# Patient Record
Sex: Female | Born: 1995 | Hispanic: Yes | Marital: Single | Smoking: Former smoker
Health system: Southern US, Community
[De-identification: ages and names within clinical notes are randomized; demographics above are authoritative.]

## PROBLEM LIST (undated history)

## (undated) ENCOUNTER — Inpatient Hospital Stay: Payer: Self-pay

## (undated) DIAGNOSIS — F4310 Post-traumatic stress disorder, unspecified: Secondary | ICD-10-CM

## (undated) DIAGNOSIS — F331 Major depressive disorder, recurrent, moderate: Secondary | ICD-10-CM

## (undated) HISTORY — DX: Post-traumatic stress disorder, unspecified: F43.10

## (undated) HISTORY — DX: Major depressive disorder, recurrent, moderate: F33.1

## (undated) HISTORY — PX: NO PAST SURGERIES: SHX2092

---
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
## 2017-03-11 ENCOUNTER — Telehealth: Payer: Self-pay | Admitting: *Deleted

## 2017-03-11 NOTE — Telephone Encounter (Signed)
I called pharmacy to verify rx still had refills.  It had one more refill. I requested the pharmacy fill the rx and pt would pick up later today.  I advised pt rx is being filled and she can pick up later. She voiced understanding and agreed with plan.

## 2017-03-11 NOTE — Telephone Encounter (Signed)
Patient has an appt on Wednesday for Depo but states pharmacy says she has no refills but chart shows it should be on refill left,please advise.Marland Kitchen..Marland Kitchen

## 2017-03-13 ENCOUNTER — Ambulatory Visit: Payer: Medicaid Other

## 2017-03-25 ENCOUNTER — Ambulatory Visit: Payer: Medicaid Other | Admitting: Pediatrics

## 2017-03-25 DIAGNOSIS — Z3040 Encounter for surveillance of contraceptives, unspecified: Secondary | ICD-10-CM

## 2017-03-25 MED ORDER — NORETHINDRONE-ETH ESTRADIOL 1-35 MG-MCG PO TABS: 1.0000 | ORAL_TABLET | Freq: Every day | ORAL | 11 refills | Status: DC

## 2017-03-25 NOTE — Progress Notes (Signed)
Per verbal order from Dr. Clearance CootsHarper:  Patient should discontinue Depo Provera and start OCP.  He recommends Ortho Novum 1/35.  I sent rx to pharmacy and advised patient to start first pill tomorrow.  I advised to take pill at the same time everyday and to use back up method if takes ATBs. She voiced understanding and agreed with plan.

## 2017-11-11 ENCOUNTER — Encounter: Payer: Self-pay | Admitting: Certified Nurse Midwife

## 2017-11-19 ENCOUNTER — Ambulatory Visit: Payer: Self-pay | Admitting: Obstetrics

## 2018-03-07 ENCOUNTER — Other Ambulatory Visit: Payer: Self-pay | Admitting: Obstetrics

## 2018-03-11 ENCOUNTER — Encounter: Payer: Self-pay | Admitting: Obstetrics

## 2018-03-11 ENCOUNTER — Ambulatory Visit (INDEPENDENT_AMBULATORY_CARE_PROVIDER_SITE_OTHER): Payer: Medicaid Other | Admitting: Obstetrics

## 2018-03-11 ENCOUNTER — Other Ambulatory Visit (HOSPITAL_COMMUNITY)
Admission: RE | Admit: 2018-03-11 | Discharge: 2018-03-11 | Disposition: A | Discharge status: Home / Self Care | Payer: Medicaid Other | Source: Ambulatory Visit | Attending: Obstetrics | Admitting: Obstetrics

## 2018-03-11 VITALS — BP 139/81 | HR 85 | Ht 64.0 in | Wt 176.0 lb

## 2018-03-11 DIAGNOSIS — B9689 Other specified bacterial agents as the cause of diseases classified elsewhere: Secondary | ICD-10-CM | POA: Diagnosis not present

## 2018-03-11 DIAGNOSIS — Z0000 Encounter for general adult medical examination without abnormal findings: Secondary | ICD-10-CM

## 2018-03-11 DIAGNOSIS — R8290 Unspecified abnormal findings in urine: Secondary | ICD-10-CM

## 2018-03-11 DIAGNOSIS — N898 Other specified noninflammatory disorders of vagina: Secondary | ICD-10-CM

## 2018-03-11 DIAGNOSIS — Z113 Encounter for screening for infections with a predominantly sexual mode of transmission: Secondary | ICD-10-CM

## 2018-03-11 DIAGNOSIS — N760 Acute vaginitis: Secondary | ICD-10-CM | POA: Insufficient documentation

## 2018-03-11 DIAGNOSIS — Z01419 Encounter for gynecological examination (general) (routine) without abnormal findings: Secondary | ICD-10-CM

## 2018-03-11 DIAGNOSIS — Z3041 Encounter for surveillance of contraceptive pills: Secondary | ICD-10-CM

## 2018-03-11 DIAGNOSIS — N946 Dysmenorrhea, unspecified: Secondary | ICD-10-CM

## 2018-03-11 LAB — POCT URINALYSIS DIPSTICK
Bilirubin, UA: NEGATIVE
Glucose, UA: NEGATIVE
Ketones, UA: NEGATIVE
LEUKOCYTES UA: NEGATIVE
Nitrite, UA: NEGATIVE
PH UA: 6 (ref 5.0–8.0)
Protein, UA: NEGATIVE
RBC UA: NEGATIVE
Spec Grav, UA: 1.025 (ref 1.010–1.025)
UROBILINOGEN UA: 0.2 U/dL

## 2018-03-11 MED ORDER — IBUPROFEN 800 MG PO TABS: 800.0000 mg | ORAL_TABLET | Freq: Three times a day (TID) | ORAL | 5 refills | Status: DC | PRN

## 2018-03-11 MED ORDER — METRONIDAZOLE 500 MG PO TABS: 500.0000 mg | ORAL_TABLET | Freq: Two times a day (BID) | ORAL | 2 refills | Status: DC

## 2018-03-11 NOTE — Addendum Note (Signed)
Addended by: Kennon Portela on: 03/11/2018 03:50 PM   Modules accepted: Orders

## 2018-03-11 NOTE — Progress Notes (Signed)
Subjective:        Kathy Walker is a 22 y.o. female here for a routine exam.  Current complaints: Odor to urine..    Personal health questionnaire:  Is patient Kathy Walker, have a family history of breast and/or ovarian cancer: no Is there a family history of uterine cancer diagnosed at age < 66, gastrointestinal cancer, urinary tract cancer, family member who is a Personnel officer syndrome-associated carrier: no Is the patient overweight and hypertensive, family history of diabetes, personal history of gestational diabetes, preeclampsia or PCOS: no Is patient over 43, have PCOS,  family history of premature CHD under age 89, diabetes, smoke, have hypertension or peripheral artery disease:  no At any time, has a partner hit, kicked or otherwise hurt or frightened you?: no Over the past 2 weeks, have you felt down, depressed or hopeless?: no Over the past 2 weeks, have you felt little interest or pleasure in doing things?:no   Gynecologic History Patient's last menstrual period was 03/04/2018 (exact date). Contraception: OCP (estrogen/progesterone) Last Pap: n/a. Results were: n/a Last mammogram: n/a. Results were: n/a  Obstetric History OB History  Gravida Para Term Preterm AB Living  2 2 2  0 0 2  SAB TAB Ectopic Multiple Live Births  0 0 0 0 2    # Outcome Date GA Lbr Len/2nd Weight Sex Delivery Anes PTL Lv  2 Term 05/23/16 [redacted]w[redacted]d 18:55 / 00:02 7 lb 12 oz (3.515 kg) F Vag-Spont None  LIV  1 Term 02/09/15 [redacted]w[redacted]d  8 lb 2 oz (3.685 kg) F Vag-Spont  N LIV    Past Medical History:  Diagnosis Date  . MDD (major depressive disorder), recurrent episode, moderate (HCC) 12/31/2012  . Post traumatic stress disorder (PTSD) 12/31/2012    Past Surgical History:  Procedure Laterality Date  . NO PAST SURGERIES       Current Outpatient Medications:  .  PIRMELLA 1/35 tablet, TAKE 1 TABLET BY MOUTH ONCE DAILY, Disp: 28 tablet, Rfl: 11 .  dicyclomine (BENTYL) 20 MG tablet, Take 1 tablet (20  mg total) by mouth 2 (two) times daily. (Patient not taking: Reported on 03/11/2018), Disp: 20 tablet, Rfl: 0 .  ibuprofen (ADVIL,MOTRIN) 600 MG tablet, Take 1 tablet (600 mg total) by mouth every 6 (six) hours. (Patient not taking: Reported on 03/11/2018), Disp: 120 tablet, Rfl: 2 .  medroxyPROGESTERone (DEPO-PROVERA) 150 MG/ML injection, Inject 1 mL (150 mg total) into the muscle every 3 (three) months. (Patient not taking: Reported on 03/11/2018), Disp: 1 mL, Rfl: 3 .  Multiple Vitamins-Minerals (MULTIVITAMIN WITH MINERALS) tablet, Take 1 tablet by mouth daily., Disp: , Rfl:  .  omeprazole (PRILOSEC) 40 MG capsule, Take 1 capsule (40 mg total) by mouth 2 (two) times daily., Disp: 28 capsule, Rfl: 0 .  ondansetron (ZOFRAN ODT) 4 MG disintegrating tablet, Take 1 tablet (4 mg total) by mouth every 8 (eight) hours as needed for nausea or vomiting. (Patient not taking: Reported on 03/11/2018), Disp: 20 tablet, Rfl: 0 .  oxyCODONE-acetaminophen (PERCOCET/ROXICET) 5-325 MG tablet, Take 1 tablet by mouth every 6 (six) hours as needed for severe pain. (Patient not taking: Reported on 07/02/2016), Disp: 12 tablet, Rfl: 0 .  Prenatal Multivit-Min-Fe-FA (PRENATAL VITAMINS) 0.8 MG tablet, Take 1 tablet by mouth daily. (Patient not taking: Reported on 09/27/2016), Disp: 30 tablet, Rfl: 12 Allergies  Allergen Reactions  . Kathy Walker Itching  . Kathy Walker Itching    Social History   Tobacco Use  . Smoking status: Former Games developer  .  Smokeless tobacco: Never Used  Substance Use Topics  . Alcohol use: No    History reviewed. No pertinent family history.    Review of Systems  Constitutional: negative for fatigue and weight loss Respiratory: negative for cough and wheezing Cardiovascular: negative for chest pain, fatigue and palpitations Gastrointestinal: negative for abdominal pain and change in bowel habits Musculoskeletal:negative for myalgias Neurological: negative for gait problems and  tremors Behavioral/Psych: negative for abusive relationship, depression Endocrine: negative for temperature intolerance    Genitourinary:negative for abnormal menstrual periods, genital lesions, hot flashes, sexual problems and vaginal discharge.  Positive for odor to urine Integument/breast: negative for breast lump, breast tenderness, nipple discharge and skin lesion(s)    Objective:       BP 139/81   Pulse 85   Ht 5\' 4"  (1.626 m)   Wt 176 lb (79.8 kg)   LMP 03/04/2018 (Exact Date)   Breastfeeding? No   BMI 30.21 kg/m  General:   alert  Skin:   no rash or abnormalities  Lungs:   clear to auscultation bilaterally  Heart:   regular rate and rhythm, S1, S2 normal, no murmur, click, rub or gallop  Breasts:   normal without suspicious masses, skin or nipple changes or axillary nodes  Abdomen:  normal findings: no organomegaly, soft, non-tender and no hernia  Pelvis:  External genitalia: normal general appearance Urinary system: urethral meatus normal and bladder without fullness, nontender Vaginal: normal without tenderness, induration or masses Cervix: normal appearance Adnexa: normal bimanual exam Uterus: anteverted and non-tender, normal size   Lab Review Urine pregnancy test Labs reviewed yes Radiologic studies reviewed no  50% of 20 min visit spent on counseling and coordination of care.   Assessment:      1. Encounter for gynecrease fluids cological examination  2. Dysmenorrhea Rx: - ibuprofen (ADVIL,MOTRIN) 800 MG tablet; Take 1 tablet (800 mg total) by mouth every 8 (eight) hours as needed.  Dispense: 40 tablet; Refill: 5  3. Abnormal urine odor Rx: - POCT Urinalysis Dipstick:  Negative, except concentrated and dark yellow indicating dehydration - increase fluids  4. Vaginal discharge - probable BV  5. BV (bacterial vaginosis) Rx: - metroNIDAZOLE (FLAGYL) 500 MG tablet; Take 1 tablet (500 mg total) by mouth 2 (two) times daily.  Dispense: 14 tablet;  Refill: 2  6. Screening for STD (sexually transmitted disease) Rx: - HIV Antibody (routine testing w rflx) - RPR  7. Encounter for surveillance of contraceptive pills - pleased with OCP's    Plan:    Education reviewed: calcium supplements, depression evaluation, low fat, low cholesterol diet, safe sex/STD prevention, self breast exams and weight bearing exercise. Contraception: OCP (estrogen/progesterone). Follow up in: 1 year.   No orders of the defined types were placed in this encounter.  Orders Placed This Encounter  Procedures  . POCT Urinalysis Dipstick    Brock Bad MD 03-11-2018

## 2018-03-11 NOTE — Progress Notes (Signed)
RGYN pt here for AEX and to discuss contraception currently on birth control pills pt has no complaints w/ pills.   Contraception: PIRMELLA  STD Testing: Declines   LMP: 03/04/18  CC: Odor in urine.advised to leave a sample.  Rash on right eye lid

## 2018-03-12 LAB — HIV ANTIBODY (ROUTINE TESTING W REFLEX): HIV SCREEN 4TH GENERATION: NONREACTIVE

## 2018-03-12 LAB — CERVICOVAGINAL ANCILLARY ONLY
BACTERIAL VAGINITIS: NEGATIVE
CANDIDA VAGINITIS: NEGATIVE
Chlamydia: NEGATIVE
Neisseria Gonorrhea: NEGATIVE
Trichomonas: NEGATIVE

## 2018-03-12 LAB — RPR: RPR Ser Ql: NONREACTIVE

## 2018-07-01 ENCOUNTER — Ambulatory Visit: Payer: Medicaid Other | Admitting: Obstetrics

## 2018-11-15 ENCOUNTER — Other Ambulatory Visit: Payer: Self-pay

## 2018-11-15 ENCOUNTER — Emergency Department (HOSPITAL_COMMUNITY)
Admission: EM | Admit: 2018-11-15 | Discharge: 2018-11-16 | Disposition: A | Discharge status: Home / Self Care | Payer: Medicaid Other | Attending: Emergency Medicine | Admitting: Emergency Medicine

## 2018-11-15 ENCOUNTER — Encounter (HOSPITAL_COMMUNITY): Payer: Self-pay | Admitting: *Deleted

## 2018-11-15 DIAGNOSIS — I6501 Occlusion and stenosis of right vertebral artery: Secondary | ICD-10-CM | POA: Insufficient documentation

## 2018-11-15 DIAGNOSIS — R42 Dizziness and giddiness: Secondary | ICD-10-CM | POA: Insufficient documentation

## 2018-11-15 DIAGNOSIS — Z87891 Personal history of nicotine dependence: Secondary | ICD-10-CM | POA: Insufficient documentation

## 2018-11-15 DIAGNOSIS — Z79899 Other long term (current) drug therapy: Secondary | ICD-10-CM | POA: Insufficient documentation

## 2018-11-15 MED ORDER — ONDANSETRON HCL 4 MG/2ML IJ SOLN
4.0000 mg | Freq: Once | INTRAMUSCULAR | Status: DC
  Filled 2018-11-15: qty 2

## 2018-11-15 MED ORDER — SODIUM CHLORIDE 0.9 % IV BOLUS
1000.0000 mL | Freq: Once | INTRAVENOUS | Status: AC
  Administered 2018-11-16: 02:00:00 1000 mL via INTRAVENOUS

## 2018-11-15 NOTE — ED Notes (Signed)
Pt denies numbness and tingling in extremities. Pt states she gets dizzy and nauseated w/movement and the light.

## 2018-11-15 NOTE — ED Provider Notes (Signed)
[facility] EMERGENCY [facility] Provider Note   CSN: [id number] Arrival date & time: 11/15/18  2023    History   Chief Complaint Chief Complaint  Patient presents with  . Neck Pain     HPI Kathy Walker is a 23 y.o. female.     The history is provided by the patient.  Neck Pain Pain location:  Occipital region Quality:  Stiffness and burning Stiffness is present:  All day Pain radiates to:  Head Pain severity:  Severe Pain is:  Same all the time Onset quality:  Sudden Duration:  3 days Timing:  Constant Progression:  Worsening Chronicity:  New Context comment:  "cracked/popped" her neck and felt instant sharp pain Relieved by:  Nothing Worsened by:  Position, stress and twisting Ineffective treatments:  Ice, heat, analgesics, NSAIDs and neck support Associated symptoms: headaches, visual change and weakness   Associated symptoms: no bladder incontinence, no bowel incontinence, no chest pain, no fever, no numbness, no syncope and no tingling     Past Medical History:  Diagnosis Date  . MDD (major depressive disorder), recurrent episode, moderate (HCC) 12/31/2012  . Post traumatic stress disorder (PTSD) 12/31/2012    Patient Active Problem List   Diagnosis Date Noted  . Indication for care in labor or delivery 05/23/2016  . Encounter for supervision of normal pregnancy in multigravida 12/19/2015  . Teen pregnancy 12/19/2015    Past Surgical History:  Procedure Laterality Date  . NO PAST SURGERIES       OB History    Gravida  2   Para  2   Term  2   Preterm  0   AB  0   Living  2     SAB  0   TAB  0   Ectopic  0   Multiple  0   Live Births  2            Home Medications    Prior to Admission medications   Medication Sig Start Date End Date Taking? Authorizing Provider  dicyclomine (BENTYL) 20 MG tablet Take 1 tablet (20 mg total) by mouth 2 (two) times daily. Patient not taking: Reported on 03/11/2018 10/09/16    Dorena BodoKennard, Lawrence, NP  ibuprofen (ADVIL,MOTRIN) 800 MG tablet Take 1 tablet (800 mg total) by mouth every 8 (eight) hours as needed. 03/11/18   Brock BadHarper, Charles A, MD  medroxyPROGESTERone (DEPO-PROVERA) 150 MG/ML injection Inject 1 mL (150 mg total) into the muscle every 3 (three) months. Patient not taking: Reported on 03/11/2018 06/29/16   Brock BadHarper, Charles A, MD  metroNIDAZOLE (FLAGYL) 500 MG tablet Take 1 tablet (500 mg total) by mouth 2 (two) times daily. 03/11/18   Brock BadHarper, Charles A, MD  Multiple Vitamins-Minerals (MULTIVITAMIN WITH MINERALS) tablet Take 1 tablet by mouth daily.    [provider]  omeprazole (PRILOSEC) 40 MG capsule Take 1 capsule (40 mg total) by mouth 2 (two) times daily. 10/09/16 10/23/16  Dorena BodoKennard, Lawrence, NP  ondansetron (ZOFRAN ODT) 4 MG disintegrating tablet Take 1 tablet (4 mg total) by mouth every 8 (eight) hours as needed for nausea or vomiting. Patient not taking: Reported on 03/11/2018 10/09/16   Dorena BodoKennard, Lawrence, NP  oxyCODONE-acetaminophen (PERCOCET/ROXICET) 5-325 MG tablet Take 1 tablet by mouth every 6 (six) hours as needed for severe pain. Patient not taking: Reported on 07/02/2016 05/30/16   Kathlene CoteWenzel, Julie N, PA-C  [facility] TAKE 1 TABLET BY MOUTH ONCE DAILY 03/08/18   Brock BadHarper, Charles A,  MD  Prenatal Multivit-Min-Fe-FA (PRENATAL VITAMINS) 0.8 MG tablet Take 1 tablet by mouth daily. Patient not taking: Reported on 09/27/2016 11/24/15   Osborne Oman, MD    Family History No family history on file.  Social History Social History   Tobacco Use  . Smoking status: Former Research scientist (life sciences)  . Smokeless tobacco: Never Used  Substance Use Topics  . Alcohol use: No  . Drug use: Yes    Types: Marijuana     Allergies   Cherry and Raspberry   Review of Systems Review of Systems  Constitutional: Negative for chills and fever.  HENT: Negative for ear pain and sore throat.   Eyes: Positive for pain (intermittent, worse on the left) and visual  disturbance (intermittent blurred vision).  Respiratory: Negative for cough and shortness of breath.   Cardiovascular: Negative for chest pain, palpitations and syncope.  Gastrointestinal: Positive for nausea. Negative for abdominal pain, bowel incontinence and vomiting.  Genitourinary: Negative for bladder incontinence, dysuria and hematuria.  Musculoskeletal: Positive for neck pain (bilateral posterior neck ) and neck stiffness. Negative for arthralgias and back pain.  Skin: Negative for color change and rash.  Neurological: Positive for dizziness, weakness, light-headedness and headaches. Negative for tingling, seizures, syncope and numbness.  All other systems reviewed and are negative.    Physical Exam Updated Vital Signs BP 126/90   Pulse 86   Temp 98.4 F (36.9 C) (Oral)   Resp 16   LMP 11/11/2018   SpO2 100%   Physical Exam Vitals signs and nursing note reviewed.  Constitutional:      General: She is awake. She is not in acute distress (appears uncomfortable).    Appearance: Normal appearance. She is well-developed.  HENT:     Head: Normocephalic and atraumatic.     Right Ear: External ear normal.     Left Ear: External ear normal.     Nose: Nose normal.     Mouth/Throat:     Lips: Pink.     Mouth: Mucous membranes are moist.  Eyes:     General: Lids are normal. Gaze aligned appropriately.     Extraocular Movements:     Right eye: Nystagmus (gaze-evoked nystagmus in upper outer quadrant) present.     Left eye: Nystagmus (gaze-evoked nystagmus in upper outer quadrant) present.     Conjunctiva/sclera: Conjunctivae normal.  Neck:     Musculoskeletal: Pain with movement and spinous process tenderness present.     Trachea: Trachea and phonation normal.  Cardiovascular:     Rate and Rhythm: Normal rate and regular rhythm.     Pulses: Normal pulses.     Heart sounds: No murmur.  Pulmonary:     Effort: Pulmonary effort is normal. No respiratory distress.     Breath  sounds: Normal breath sounds.  Abdominal:     General: Abdomen is flat.     Palpations: Abdomen is soft.     Tenderness: There is no abdominal tenderness.  Musculoskeletal: Normal range of motion.  Lymphadenopathy:     Cervical: No cervical adenopathy.  Skin:    General: Skin is warm and dry.  Neurological:     General: No focal deficit present.     Mental Status: She is alert and oriented to person, place, and time.     GCS: GCS eye subscore is 4. GCS verbal subscore is 5. GCS motor subscore is 6.     Comments: Dysmetria noted with finger-nose testing.  No overt overshoot lower undershoot, palpation significantly  sluggish with movement and became dizzy with testing.  Psychiatric:        Behavior: Behavior is cooperative.      ED Treatments / Results  Labs (all labs ordered are listed, but only abnormal results are displayed) Labs Reviewed  BASIC METABOLIC PANEL - Abnormal; Notable for the following components:      Result Value   CO2 21 (*)    All other components within normal limits  CBC WITH DIFFERENTIAL/PLATELET    EKG None  Radiology No results found.  CTA Head and Neck pending  Procedures Procedures (including critical care time)  Medications Ordered in ED Medications  sodium chloride 0.9 % bolus 1,000 mL (has no administration in time range)  ondansetron (ZOFRAN) injection 4 mg (has no administration in time range)     Initial Impression / Assessment and Plan / ED Course  I have reviewed the triage vital signs and the nursing notes.  Pertinent labs & imaging results that were available during my care of the patient were reviewed by me and considered in my medical decision making (see chart for details).  Differentials considered: Cervical muscle strain, carotid dissection, vertebral artery dissection, cervical disc herniation, occipital headache, tension headache   EM Physician interpretation of Labs:  . Labs pending at time of handoff   Medical  Decision Making:  Kathy Walker is a 23 y.o. female with past medical history of MDD and PTSD who presented to the emergency department today for evaluation of 3-day history of sudden onset throbbing and sharp bilateral posterior neck pain following cracking/popping her neck.  She reported that she frequently cracks her neck when she is stressed and believes that when she did so 3 days ago she believes that she did it too rapidly and too hard.  Since that time she has had constant and progressive posterior neck pain that radiates up the occipital portion of her head across the top into her frontal head.  She does not had any relief with OTC Tylenol, NSAIDs, heating pads, warm showers, cold pack.  She has had intermittent dizziness and lightheadedness as well as occasional blurred vision since this incident.  She is also had nausea with position changes.  She arrived afebrile and hemodynamically stable.  She appeared uncomfortable.  Her physical examination as described above but is concerning for several findings.  Of note, patient was seen to become dizzy upon sitting up from a lying position.  She also had what appeared to be gaze evoked nystagmus bilaterally when looking upward and outward while testing extraocular movements, she also was noted to have bilateral horizontal beating nystagmus.  During cerebellar coordination testing with finger-to nose assessment, she was quite sluggish with her movements and again appeared to become dizzy when her arm was outstretched all the way.   At the time of transfer of care, patient's lab work and imaging is pending.  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   This patient was staffed with and care transferred to my attending physician, Dr. Geoffery Lyonsouglas Delo.    Final Clinical Impressions(s) / ED Diagnoses   Final diagnoses:  None      Saverio DankerWilliams, Nikoleta Dady A, MD 11/16/18 [id number]    Geoffery Lyonselo, Douglas, MD 11/16/18 [phone]

## 2018-11-15 NOTE — ED Triage Notes (Signed)
Pt "cracked her neck" 3 days ago; reports increased pain on both side of her neck with severe headache. Also reports dizziness when standing too long. Has tried numerous different OTC meds without any relief

## 2018-11-15 NOTE — ED Notes (Signed)
Pt ambulatory to bathroom

## 2018-11-16 ENCOUNTER — Emergency Department (HOSPITAL_COMMUNITY): Payer: Medicaid Other

## 2018-11-16 DIAGNOSIS — S15101A Unspecified injury of right vertebral artery, initial encounter: Secondary | ICD-10-CM

## 2018-11-16 LAB — CBC WITH DIFFERENTIAL/PLATELET
Abs Immature Granulocytes: 0.01 10*3/uL (ref 0.00–0.07)
Basophils Absolute: 0 10*3/uL (ref 0.0–0.1)
Basophils Relative: 0 %
Eosinophils Absolute: 0.1 10*3/uL (ref 0.0–0.5)
Eosinophils Relative: 1 %
HCT: 44.6 % (ref 36.0–46.0)
Hemoglobin: 14.5 g/dL (ref 12.0–15.0)
Immature Granulocytes: 0 %
Lymphocytes Relative: 57 %
Lymphs Abs: 2.8 10*3/uL (ref 0.7–4.0)
MCH: 28.6 pg (ref 26.0–34.0)
MCHC: 32.5 g/dL (ref 30.0–36.0)
MCV: 88 fL (ref 80.0–100.0)
Monocytes Absolute: 0.4 10*3/uL (ref 0.1–1.0)
Monocytes Relative: 7 %
Neutro Abs: 1.7 10*3/uL (ref 1.7–7.7)
Neutrophils Relative %: 35 %
Platelets: 267 10*3/uL (ref 150–400)
RBC: 5.07 MIL/uL (ref 3.87–5.11)
RDW: 12.5 % (ref 11.5–15.5)
WBC: 5 10*3/uL (ref 4.0–10.5)
nRBC: 0 % (ref 0.0–0.2)

## 2018-11-16 LAB — BASIC METABOLIC PANEL
Anion gap: 10 (ref 5–15)
BUN: 8 mg/dL (ref 6–20)
CO2: 21 mmol/L — ABNORMAL LOW (ref 22–32)
Calcium: 8.9 mg/dL (ref 8.9–10.3)
Chloride: 110 mmol/L (ref 98–111)
Creatinine, Ser: 0.71 mg/dL (ref 0.44–1.00)
GFR calc Af Amer: >60 mL/min (ref >60)
GFR calc non Af Amer: >60 mL/min (ref >60)
Glucose, Bld: 89 mg/dL (ref 70–99)
Potassium: 3.5 mmol/L (ref 3.5–5.1)
Sodium: 141 mmol/L (ref 135–145)

## 2018-11-16 MED ORDER — IOHEXOL 350 MG/ML SOLN
75.0000 mL | Freq: Once | INTRAVENOUS | Status: AC | PRN
  Administered 2018-11-16: 01:00:00 75 mL via INTRAVENOUS

## 2018-11-16 NOTE — Discharge Instructions (Addendum)
Begin taking a 325 mg aspirin once daily.  You are to follow-up in 3 months with [facility] neurology for a repeat CT Angiogram of your neck.  Return to the emergency department in the meantime if you develop worsening headache, severe dizziness, or other new and concerning symptoms.

## 2018-11-16 NOTE — Consult Note (Addendum)
Neurology Consultation  Reason for Consult: Headache, neck pain, abnormal CT angiogram head and neck Referring Physician: Dr. Judd LienELO  CC: Headache, neck pain  History is obtained from: Patient  HPI: Kathy Walker is a 23 y.o. female who has a past medical history of PTSD, major depressive disorder, presented to the emergency room for evaluation of headache that has now been going on for 3 days and has gradually worsened to the point where she deemed appropriate to seek medical care. She reports that she is in the habit of cracking her neck side to side occasionally and she did that 3 days ago and since then has been having pain in the back of her neck and radiating to her head that has worsened and not responded to over-the-counter treatment such as Tylenol. She also reported initially feeling a little dizzy which she described as lightheadedness if she stands too long and at this time denied any visual symptoms, but the ED provider says at one point she said yes to having had some visual changes with the headache. Does not have a strong history of migraines.  This pain in the neck and head is worsened by movement and certain positions.  No relieving factors including over-the-counter medications. Initial examination by the ED provider was concerning for possible dysmetria and some nystagmus for which a head CT and a CT angios head and neck was obtained-CT head was normal but the CT angios head and neck showed a small segment of irregularity in the right vertebral artery V2 segment concerning for a grade 1 blunt cerebrovascular injury.  Neurological consultation was obtained for further recommendations.  She denies any chest pain.  Reports shortness of breath due to her increased weight which is normal for her.  Denies any tingling numbness weakness.  Denies any frank double vision diplopia.  Reports some photophobia but no other visual symptoms to me.  No family history of strokes.  No family  history of headache.  Describes herself as a recent tobacco quitter-quit smoking a week ago.  Also has a history of marijuana abuse that she quit a week ago.  LKW: 3 days ago tpa given?: no, outside the window Premorbid modified Rankin scale (mRS):0  ROS: ROS was performed and is negative except as noted in the HPI.   Past Medical History:  Diagnosis Date  . MDD (major depressive disorder), recurrent episode, moderate (HCC) 12/31/2012  . Post traumatic stress disorder (PTSD) 12/31/2012   No family history on file. No family history of strokes  Social History:   reports that she has quit smoking. She has never used smokeless tobacco. She reports current drug use. Drug: Marijuana. She reports that she does not drink alcohol. She reports she has quit smoking about a week ago.  Also uses marijuana, which she reports having quit a week ago.  Does not drink alcohol.  Medications  Current Facility-Administered Medications:  .  ondansetron (ZOFRAN) injection 4 mg, 4 mg, Intravenous, Once, Saverio DankerWilliams, Drew A, MD  Current Outpatient Medications:  .  acetaminophen (TYLENOL) 325 MG tablet, Take 325 mg by mouth every 6 (six) hours as needed for mild pain., Disp: , Rfl:  .  ibuprofen (ADVIL) 200 MG tablet, Take 200 mg by mouth every 6 (six) hours as needed for moderate pain., Disp: , Rfl:  .  PIRMELLA 1/35 tablet, TAKE 1 TABLET BY MOUTH ONCE DAILY (Patient taking differently: Take 1 tablet by mouth daily. ), Disp: 28 tablet, Rfl: 11 .  dicyclomine (BENTYL)  20 MG tablet, Take 1 tablet (20 mg total) by mouth 2 (two) times daily. (Patient not taking: Reported on 03/11/2018), Disp: 20 tablet, Rfl: 0 .  ibuprofen (ADVIL,MOTRIN) 800 MG tablet, Take 1 tablet (800 mg total) by mouth every 8 (eight) hours as needed. (Patient not taking: Reported on 11/16/2018), Disp: 40 tablet, Rfl: 5 .  medroxyPROGESTERone (DEPO-PROVERA) 150 MG/ML injection, Inject 1 mL (150 mg total) into the muscle every 3 (three) months.  (Patient not taking: Reported on 03/11/2018), Disp: 1 mL, Rfl: 3 .  metroNIDAZOLE (FLAGYL) 500 MG tablet, Take 1 tablet (500 mg total) by mouth 2 (two) times daily. (Patient not taking: Reported on 11/16/2018), Disp: 14 tablet, Rfl: 2 .  omeprazole (PRILOSEC) 40 MG capsule, Take 1 capsule (40 mg total) by mouth 2 (two) times daily. (Patient not taking: Reported on 11/16/2018), Disp: 28 capsule, Rfl: 0 .  ondansetron (ZOFRAN ODT) 4 MG disintegrating tablet, Take 1 tablet (4 mg total) by mouth every 8 (eight) hours as needed for nausea or vomiting. (Patient not taking: Reported on 03/11/2018), Disp: 20 tablet, Rfl: 0 .  oxyCODONE-acetaminophen (PERCOCET/ROXICET) 5-325 MG tablet, Take 1 tablet by mouth every 6 (six) hours as needed for severe pain. (Patient not taking: Reported on 07/02/2016), Disp: 12 tablet, Rfl: 0 .  Prenatal Multivit-Min-Fe-FA (PRENATAL VITAMINS) 0.8 MG tablet, Take 1 tablet by mouth daily. (Patient not taking: Reported on 09/27/2016), Disp: 30 tablet, Rfl: 12  Exam: Current vital signs: BP 115/67   Pulse 80   Temp 98.4 F (36.9 C) (Oral)   Resp 16   LMP 11/11/2018   SpO2 100%  Vital signs in last 24 hours: Temp:  [98.4 F (36.9 C)] 98.4 F (36.9 C) (07/04 2027) Pulse Rate:  [80-86] 80 (07/05 0147) Resp:  [16] 16 (07/05 0147) BP: (115-129)/(67-100) 115/67 (07/05 0147) SpO2:  [100 %] 100 % (07/04 2027)  GENERAL: Awake, alert in NAD HEENT: - Normocephalic and atraumatic, dry mm, no LN++, no Thyromegally.  On palpation, she has tenderness all over her shoulders and at the back of the neck without any specific area that stands out worse than the other.  On temple palpation, headache actually feels better and not worse. LUNGS -breathing normally saturating well on room air CV - S1S2 RRR, no m/r/g, equal pulses bilaterally. ABDOMEN - Soft, nontender, nondistended with normoactive BS Ext: warm, well perfused, intact peripheral pulses, no edema  NEURO:  Mental Status:  AA&Ox3 Language: speech is non-dysarthric.  Naming, repetition, fluency, and comprehension intact. Cranial Nerves: PERRL. EOMI with bilateral end gaze nystagmus that is not sustained, visual fields full, no facial asymmetry, facial sensation intact, hearing intact, tongue/uvula/soft palate midline, normal sternocleidomastoid and trapezius muscle strength. No evidence of tongue atrophy or fibrillations Motor: 5/5 with no vertical drift Tone: is normal and bulk is normal Sensation- Intact to light touch bilaterally Coordination: FTN intact bilaterally, no ataxia in BLE. Gait- deferred NIHSS = 0  Labs I have reviewed labs in epic and the results pertinent to this consultation are:  CBC    Component Value Date/Time   WBC 5.0 11/16/2018 0003   RBC 5.07 11/16/2018 0003   HGB 14.5 11/16/2018 0003   HGB 13.0 03/16/2016 1045   HCT 44.6 11/16/2018 0003   HCT 38.8 03/16/2016 1045   PLT 267 11/16/2018 0003   PLT 258 03/16/2016 1045   MCV 88.0 11/16/2018 0003   MCV 88 03/16/2016 1045   MCV 85 06/16/2014 2155   MCH 28.6 11/16/2018 0003  MCHC 32.5 11/16/2018 0003   RDW 12.5 11/16/2018 0003   RDW 13.8 03/16/2016 1045   RDW 13.5 06/16/2014 2155   LYMPHSABS 2.8 11/16/2018 0003   LYMPHSABS 2.4 11/21/2015 1348   LYMPHSABS 2.6 06/16/2014 2155   MONOABS 0.4 11/16/2018 0003   MONOABS 1.1 (H) 06/16/2014 2155   EOSABS 0.1 11/16/2018 0003   EOSABS 0.1 11/21/2015 1348   EOSABS 0.1 06/16/2014 2155   BASOSABS 0.0 11/16/2018 0003   BASOSABS 0.0 11/21/2015 1348   BASOSABS 0.0 06/16/2014 2155    CMP     Component Value Date/Time   NA 141 11/16/2018 0003   NA 143 (H) 05/10/2014 2219   K 3.5 11/16/2018 0003   K 3.4 05/10/2014 2219   CL 110 11/16/2018 0003   CL 109 (H) 05/10/2014 2219   CO2 21 (L) 11/16/2018 0003   CO2 27 (H) 05/10/2014 2219   GLUCOSE 89 11/16/2018 0003   GLUCOSE 93 05/10/2014 2219   BUN 8 11/16/2018 0003   BUN 10 05/10/2014 2219   CREATININE 0.71 11/16/2018 0003    CREATININE 0.78 05/10/2014 2219   CALCIUM 8.9 11/16/2018 0003   CALCIUM 9.0 05/10/2014 2219   PROT 7.4 05/10/2014 2219   ALBUMIN 3.8 05/10/2014 2219   AST 16 05/10/2014 2219   ALT 16 05/10/2014 2219   ALKPHOS 94 05/10/2014 2219   BILITOT 0.4 05/10/2014 2219   GFRNONAA >60 11/16/2018 0003   GFRAA >60 11/16/2018 0003  Imaging I have reviewed the images obtained: CT-scan of the brain-no acute changes CTA head and neck shows a 13 mm segment of right vertebral artery irregularity without high-grade stenosis as it crosses the C2 foramen-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  Normal bilateral carotids and left vertebral artery.  Normal CTA of the head.  Assessment: 23 year old woman past history of PTSD and major depressive disorder presenting for evaluation of headache and neck pain that started 3 days ago after an acute episode of cracking of her neck. She cracks her neck often but this is the first time she is ever had pain in the back of her neck radiating to her head, gradually worsened over 3 days with no relieving factors.  CT head normal. 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  Her exam is essentially reassuring other than the headache and occasional dizziness. I would like to obtain further imaging to rule out a stroke as well as rule out other causes of headache such as dural venous sinus thrombosis since she is on birth control and a tobacco abuser. I have discussed this case with neuroradiology-who recommend obtaining MRA of the neck with fat-saturated T1 or proton-density images to better evaluate the extent of vessel wall injury-which might be helpful in making decisions about further management.  Impression: Evaluate for blunt cerebrovascular injury Evaluate for stroke Headache  Recommendations: MRI brain without contrast MRA neck without contrast-with  fat-saturated T1 or proton-density images in the axial plane. MRV head Further recommendations based on imaging   -- Amie Portland, MD [facility] Pager: [phone] If 7pm to 7am, please call on call as listed on AMION.   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 No dissection, no dissection flap, no mural thrombus discernible on CTA or MRA.  Recommendations: -Start aspirin 325 p.o. daily for at least 3 months -[facility].  Get CTA head and neck and reassess the area of the injury in the right vertebral artery. Decision to continue or d/c aspirin to be made after the 3 month outpatient evaluation. -No indication for inpatient anticoagulation or use of outpatient anticoagulation based on literature. -I have also recommended that she see a counselor for anxiety as she reports neck cracking as a mechanism to relieve her anxiety. She will follow-up with her primary care for that  Relayed my plan to Dr. Judd Lienelo in the emergency room.  - -- Milon DikesAshish Isaack Preble, MD [facility] Pager: [phone] If 7pm to 7am, please call on call as listed on AMION.

## 2018-11-16 NOTE — ED Notes (Signed)
Patient transported to MRI 

## 2018-11-16 NOTE — ED Notes (Signed)
Pt returned from MRI °

## 2018-11-16 NOTE — ED Notes (Signed)
Patient verbalizes understanding of discharge instructions. Opportunity for questioning and answers were provided. Armband removed by staff, pt discharged from ED ambulatory with parents to pick up

## 2019-01-19 ENCOUNTER — Encounter (HOSPITAL_COMMUNITY): Payer: Self-pay | Admitting: Emergency Medicine

## 2019-01-19 ENCOUNTER — Other Ambulatory Visit: Payer: Self-pay

## 2019-01-19 ENCOUNTER — Emergency Department (HOSPITAL_COMMUNITY)
Admission: EM | Admit: 2019-01-19 | Discharge: 2019-01-19 | Disposition: A | Discharge status: Home / Self Care | Payer: Medicaid Other | Attending: Emergency Medicine | Admitting: Emergency Medicine

## 2019-01-19 DIAGNOSIS — Z87891 Personal history of nicotine dependence: Secondary | ICD-10-CM | POA: Insufficient documentation

## 2019-01-19 DIAGNOSIS — T7840XA Allergy, unspecified, initial encounter: Secondary | ICD-10-CM | POA: Insufficient documentation

## 2019-01-19 DIAGNOSIS — L299 Pruritus, unspecified: Secondary | ICD-10-CM | POA: Insufficient documentation

## 2019-01-19 DIAGNOSIS — F1210 Cannabis abuse, uncomplicated: Secondary | ICD-10-CM | POA: Insufficient documentation

## 2019-01-19 DIAGNOSIS — R21 Rash and other nonspecific skin eruption: Secondary | ICD-10-CM

## 2019-01-19 MED ORDER — PREDNISONE 20 MG PO TABS: 60.0000 mg | ORAL_TABLET | Freq: Every day | ORAL | 0 refills | Status: DC

## 2019-01-19 MED ORDER — PREDNISONE 20 MG PO TABS
60.0000 mg | ORAL_TABLET | Freq: Once | ORAL | Status: AC
  Administered 2019-01-19: 21:00:00 60 mg via ORAL
  Filled 2019-01-19: qty 3

## 2019-01-19 MED ORDER — DIPHENHYDRAMINE HCL 25 MG PO CAPS
25.0000 mg | ORAL_CAPSULE | Freq: Once | ORAL | Status: AC
  Administered 2019-01-19: 25 mg via ORAL
  Filled 2019-01-19: qty 1

## 2019-01-19 NOTE — ED Triage Notes (Signed)
Patient reports itchy rashes at chest and abdomen today, pt. suspects allergy to shrimps that she ate 2 days ago , respirations unlabored/ no stridor , no oral swelling , respirations unlabored.

## 2019-01-19 NOTE — Discharge Instructions (Addendum)
You were seen in the emergency department for possible allergic reaction with itching and a rash on your chest.  You were given a dose of prednisone and you will need 4 more days of this.  You should use 1 or 2 tablets of Benadryl every 4-6 hours as needed for itching.  Please keep well-hydrated.  Follow-up with your doctor return if any worsening symptoms.

## 2019-01-19 NOTE — ED Provider Notes (Signed)
[facility] EMERGENCY DEPARTMENT Provider Note   CSN: [id number] Arrival date & time: 01/19/19  2003     History   Chief Complaint Chief Complaint  Patient presents with  . Rash    HPI Kathy Walker is a 23 y.o. female.  She is complaining of 2 days of itchiness on her skin and feeling some tightness around her neck.  She is not sure if it is related to a new tattoo she got on Saturday or if it is the shellfish that she ate.  She denies any shortness of breath or difficulty swallowing or speaking.  She is noticed a few red bumps over her anterior chest but they do not seem to be progressing.  She has tried nothing for it.  She is not sure if this is just related to her anxiety now but she keeps itching.  No cough no vomiting or diarrhea.  Denies chance of being pregnant.     The history is provided by the patient.  Allergic Reaction Presenting symptoms: itching and rash   Severity:  Moderate Duration:  2 days Prior allergic episodes:  No prior episodes Context: food   Relieved by:  None tried Worsened by:  Nothing Ineffective treatments:  None tried   Past Medical History:  Diagnosis Date  . MDD (major depressive disorder), recurrent episode, moderate (HCC) 12/31/2012  . Post traumatic stress disorder (PTSD) 12/31/2012    Patient Active Problem List   Diagnosis Date Noted  . Indication for care in labor or delivery 05/23/2016  . Encounter for supervision of normal pregnancy in multigravida 12/19/2015  . Teen pregnancy 12/19/2015    Past Surgical History:  Procedure Laterality Date  . NO PAST SURGERIES       OB History    Gravida  2   Para  2   Term  2   Preterm  0   AB  0   Living  2     SAB  0   TAB  0   Ectopic  0   Multiple  0   Live Births  2            Home Medications    Prior to Admission medications   Medication Sig Start Date End Date Taking? Authorizing Provider  acetaminophen (TYLENOL) 325 MG tablet Take  325 mg by mouth every 6 (six) hours as needed for mild pain.    [provider]  dicyclomine (BENTYL) 20 MG tablet Take 1 tablet (20 mg total) by mouth 2 (two) times daily. Patient not taking: Reported on 03/11/2018 10/09/16   Dorena Bodo, NP  ibuprofen (ADVIL) 200 MG tablet Take 200 mg by mouth every 6 (six) hours as needed for moderate pain.    [provider]  ibuprofen (ADVIL,MOTRIN) 800 MG tablet Take 1 tablet (800 mg total) by mouth every 8 (eight) hours as needed. Patient not taking: Reported on 11/16/2018 03/11/18   Brock Bad, MD  medroxyPROGESTERone (DEPO-PROVERA) 150 MG/ML injection Inject 1 mL (150 mg total) into the muscle every 3 (three) months. Patient not taking: Reported on 03/11/2018 06/29/16   Brock Bad, MD  metroNIDAZOLE (FLAGYL) 500 MG tablet Take 1 tablet (500 mg total) by mouth 2 (two) times daily. Patient not taking: Reported on 11/16/2018 03/11/18   Brock Bad, MD  omeprazole (PRILOSEC) 40 MG capsule Take 1 capsule (40 mg total) by mouth 2 (two) times daily. Patient not taking: Reported on 11/16/2018 10/09/16 10/23/16  Barnet Glasgow, NP  ondansetron (ZOFRAN ODT) 4 MG disintegrating tablet Take 1 tablet (4 mg total) by mouth every 8 (eight) hours as needed for nausea or vomiting. Patient not taking: Reported on 03/11/2018 10/09/16   Barnet Glasgow, NP  oxyCODONE-acetaminophen (PERCOCET/ROXICET) 5-325 MG tablet Take 1 tablet by mouth every 6 (six) hours as needed for severe pain. Patient not taking: Reported on 07/02/2016 05/30/16   Luvenia Redden, PA-C  [facility] 1/35 tablet TAKE 1 TABLET BY MOUTH ONCE DAILY Patient taking differently: Take 1 tablet by mouth daily.  03/08/18   Shelly Bombard, MD  Prenatal Multivit-Min-Fe-FA (PRENATAL VITAMINS) 0.8 MG tablet Take 1 tablet by mouth daily. Patient not taking: Reported on 09/27/2016 11/24/15   Osborne Oman, MD    Family History No family history on file.  Social History  Social History   Tobacco Use  . Smoking status: Former Research scientist (life sciences)  . Smokeless tobacco: Never Used  Substance Use Topics  . Alcohol use: No  . Drug use: Yes    Types: Marijuana     Allergies   Cherry and Raspberry   Review of Systems Review of Systems  Constitutional: Negative for fever.  HENT: Negative for sore throat.   Eyes: Negative for visual disturbance.  Respiratory: Negative for shortness of breath.   Cardiovascular: Negative for chest pain.  Gastrointestinal: Negative for abdominal pain.  Genitourinary: Negative for dysuria.  Musculoskeletal: Negative for neck pain.  Skin: Positive for itching and rash.  Neurological: Negative for headaches.     Physical Exam Updated Vital Signs BP (!) 154/108 (BP Location: Right Arm)   Pulse (!) 107   Temp 98.6 F (37 C) (Oral)   Resp 18   LMP 01/05/2019   SpO2 100%   Physical Exam Vitals signs and nursing note reviewed.  Constitutional:      General: She is not in acute distress.    Appearance: She is well-developed.  HENT:     Head: Normocephalic and atraumatic.     Mouth/Throat:     Pharynx: Oropharynx is clear. Uvula midline. No pharyngeal swelling, oropharyngeal exudate, posterior oropharyngeal erythema or uvula swelling.  Eyes:     Conjunctiva/sclera: Conjunctivae normal.  Neck:     Musculoskeletal: Neck supple.  Cardiovascular:     Rate and Rhythm: Normal rate and regular rhythm.     Pulses: Normal pulses.     Heart sounds: No murmur.  Pulmonary:     Effort: Pulmonary effort is normal. No respiratory distress.     Breath sounds: Normal breath sounds. No stridor.  Abdominal:     Palpations: Abdomen is soft.     Tenderness: There is no abdominal tenderness.  Skin:    General: Skin is warm and dry.       Neurological:     Mental Status: She is alert.      ED Treatments / Results  Labs (all labs ordered are listed, but only abnormal results are displayed) Labs Reviewed - No data to display  EKG  None  Radiology No results found.  Procedures Procedures (including critical care time)  Medications Ordered in ED Medications  predniSONE (DELTASONE) tablet 60 mg (has no administration in time range)  diphenhydrAMINE (BENADRYL) capsule 25 mg (has no administration in time range)     Initial Impression / Assessment and Plan / ED Course  I have reviewed the triage vital signs and the nursing notes.  Pertinent labs & imaging results that were available during my care of the  patient were reviewed by me and considered in my medical decision making (see chart for details).         Final Clinical Impressions(s) / ED Diagnoses   Final diagnoses:  Allergic reaction, initial encounter  Rash    ED Discharge Orders         Ordered    predniSONE (DELTASONE) 20 MG tablet  Daily     01/19/19 2158           Terrilee FilesButler, Michael C, MD 01/19/19 2317

## 2019-01-20 ENCOUNTER — Telehealth: Payer: Self-pay

## 2019-01-20 NOTE — Telephone Encounter (Signed)
Patient called and left VM with complaints of nausea, she reports she was seen in the ED yesterday for an allergic reaction. Called patient back to discuss symptoms with her, call went to VM and I left a message for her to return the call.

## 2019-03-06 ENCOUNTER — Other Ambulatory Visit: Payer: Self-pay | Admitting: Obstetrics

## 2019-03-13 ENCOUNTER — Ambulatory Visit (INDEPENDENT_AMBULATORY_CARE_PROVIDER_SITE_OTHER): Payer: Medicaid Other | Admitting: Family Medicine

## 2019-03-13 ENCOUNTER — Other Ambulatory Visit (HOSPITAL_COMMUNITY)
Admission: RE | Admit: 2019-03-13 | Discharge: 2019-03-13 | Disposition: A | Discharge status: Home / Self Care | Payer: Medicaid Other | Source: Ambulatory Visit | Attending: Family Medicine | Admitting: Family Medicine

## 2019-03-13 ENCOUNTER — Encounter: Payer: Self-pay | Admitting: Family Medicine

## 2019-03-13 ENCOUNTER — Other Ambulatory Visit: Payer: Self-pay

## 2019-03-13 VITALS — BP 129/89 | HR 80 | Ht 64.0 in | Wt 186.0 lb

## 2019-03-13 DIAGNOSIS — Z0000 Encounter for general adult medical examination without abnormal findings: Secondary | ICD-10-CM | POA: Diagnosis not present

## 2019-03-13 DIAGNOSIS — Z124 Encounter for screening for malignant neoplasm of cervix: Secondary | ICD-10-CM

## 2019-03-13 DIAGNOSIS — Z3041 Encounter for surveillance of contraceptive pills: Secondary | ICD-10-CM

## 2019-03-13 DIAGNOSIS — N760 Acute vaginitis: Secondary | ICD-10-CM | POA: Insufficient documentation

## 2019-03-13 DIAGNOSIS — Z113 Encounter for screening for infections with a predominantly sexual mode of transmission: Secondary | ICD-10-CM | POA: Diagnosis not present

## 2019-03-13 DIAGNOSIS — Z01419 Encounter for gynecological examination (general) (routine) without abnormal findings: Secondary | ICD-10-CM

## 2019-03-13 DIAGNOSIS — N764 Abscess of vulva: Secondary | ICD-10-CM | POA: Insufficient documentation

## 2019-03-13 DIAGNOSIS — Z309 Encounter for contraceptive management, unspecified: Secondary | ICD-10-CM | POA: Insufficient documentation

## 2019-03-13 DIAGNOSIS — B9689 Other specified bacterial agents as the cause of diseases classified elsewhere: Secondary | ICD-10-CM | POA: Insufficient documentation

## 2019-03-13 MED ORDER — PIRMELLA 1/35 1-35 MG-MCG PO TABS: 1.0000 | ORAL_TABLET | Freq: Every day | ORAL | 11 refills | Status: DC

## 2019-03-13 NOTE — Assessment & Plan Note (Signed)
Recent diagnosis, s/p treatment with unremarkable exam. Unlikely but wet prep collected today per patient request, instructed to stop douching.

## 2019-03-13 NOTE — Progress Notes (Signed)
GYNECOLOGY OFFICE VISIT NOTE  History:   Kathy Walker is a 23 y.o. [phone] here today for annual gyn visit.  #Labial concern Reports R labial swelling that has been present for a few weeks Yesterday popped it and yellow green fluid came out Denies fevers, n/v, chills Endorses shaving  #BV concern Went to [facility] a month ago and diagnosed with BV Given rx for flagyl and instructed to not have sex for a week but then did two days later Very concerned she may still have BV because of this Had been douching but has stopped recently  #contraception Using OCPs, happy with this method  #STI screen Reports negative screening at [facility] a month ago  Past Medical History:  Diagnosis Date  . MDD (major depressive disorder), recurrent episode, moderate (HCC) 12/31/2012  . Post traumatic stress disorder (PTSD) 12/31/2012    Past Surgical History:  Procedure Laterality Date  . NO PAST SURGERIES      The following portions of the patient's history were reviewed and updated as appropriate: allergies, current medications, past family history, past medical history, past social history, past surgical history and problem list.   Health Maintenance:  No prior pap or mammogram   Review of Systems:  Pertinent items noted in HPI and remainder of comprehensive ROS otherwise negative.  Physical Exam:  BP 129/89   Pulse 80   Ht 5\' 4"  (1.626 m)   Wt 186 lb (84.4 kg)   LMP 03/03/2019   BMI 31.93 kg/m  CONSTITUTIONAL: Well-developed, well-nourished female in no acute distress.  HEENT:  Normocephalic, atraumatic. External right and left ear normal. No scleral icterus.  NECK: Normal range of motion, supple, no masses noted on observation SKIN: No rash noted. Not diaphoretic. No erythema. No pallor. MUSCULOSKELETAL: Normal range of motion. No edema noted. NEUROLOGIC: Alert and oriented to person, place, and time. Normal muscle tone coordination.  PSYCHIATRIC: Anxious affect. RESPIRATORY: Effort  normal, no problems with respiration noted ABDOMEN: No masses noted. No other overt distention noted.   PELVIC: Mild swelling of R labia with likely healing boil, no erythema, induration, or drainage. Vaginal mucosa with normal appearance, no discharge. Cervix with normal appearance, no discharge.   Labs and Imaging No results found for this or any previous visit (from the past 168 hour(s)). No results found.    Assessment and Plan:   Problem List Items Addressed This Visit      Genitourinary   Labial abscess    Likely resolving labial abscess secondary to shaving and manipulation by patient, instructed to stop shaving and do warm soaks, defer antibiotics as no signs of infection at present.       BV (bacterial vaginosis)    Recent diagnosis, s/p treatment with unremarkable exam. Unlikely but wet prep collected today per patient request, instructed to stop douching.         Other   Contraception management - Primary    Would like to continue with OCP's, refill sent.       Relevant Medications   PIRMELLA 1/35 tablet   Well woman exam    #Preventative: pap collected today, not due for other cancer screening #Mood: significant anxiety, encouraged to seek PCP to manage this condition and follow over time, would likely benefit from medication #STI screening: declines as recently normal at [facility] one month ago #Weight: obese, counseled on diet/exercise/weight loss #Contraception: cont OCPs       Other Visit Diagnoses    Screening for STD (sexually transmitted  disease)       Relevant Orders   Cervicovaginal ancillary only( [place])   Screening for cervical cancer       Relevant Orders   Cytology - PAP      Routine preventative health maintenance measures emphasized. Please refer to After Visit Summary for other counseling recommendations.   Return in about 1 year (around 03/12/2020), or if symptoms worsen or fail to improve, for Annual Wellness Visit.    Total  face-to-face time with patient: 20 minutes.  Over 50% of encounter was spent on counseling and coordination of care.   Augustin Coupe, [place] for [employer], [place]

## 2019-03-13 NOTE — Assessment & Plan Note (Signed)
Likely resolving labial abscess secondary to shaving and manipulation by patient, instructed to stop shaving and do warm soaks, defer antibiotics as no signs of infection at present.

## 2019-03-13 NOTE — Assessment & Plan Note (Signed)
#  Preventative: pap collected today, not due for other cancer screening #Mood: significant anxiety, encouraged to seek PCP to manage this condition and follow over time, would likely benefit from medication #STI screening: declines as recently normal at [facility] one month ago #Weight: obese, counseled on diet/exercise/weight loss #Contraception: cont OCPs

## 2019-03-13 NOTE — Progress Notes (Signed)
Patient presents for her Annual Exam today.  LMP:03/03/19 Last pap: N/A due to age Contraception: Birth Control pills  STD Screening: Desires  Denies Family Hx of Breast Cancer   CC: vaginal discharge pt believes she may have BV. Pt states she noticed a bump in the vaginal area tried to pop herself but area got worse and swollen pt states bumped popped yesterday  and had drainage of pus pt denies any pain.   Pt consents to student in exam room .

## 2019-03-13 NOTE — Assessment & Plan Note (Signed)
Would like to continue with OCP's, refill sent.

## 2019-03-16 LAB — CERVICOVAGINAL ANCILLARY ONLY
Bacterial Vaginitis (gardnerella): NEGATIVE
Candida Glabrata: NEGATIVE
Candida Vaginitis: NEGATIVE
Chlamydia: NEGATIVE
Comment: NEGATIVE
Comment: NEGATIVE
Comment: NEGATIVE
Comment: NEGATIVE
Comment: NEGATIVE
Comment: NORMAL
Neisseria Gonorrhea: NEGATIVE
Trichomonas: NEGATIVE

## 2019-03-16 LAB — CYTOLOGY - PAP: Diagnosis: NEGATIVE

## 2019-04-21 ENCOUNTER — Emergency Department (HOSPITAL_COMMUNITY): Payer: Medicaid Other

## 2019-04-21 ENCOUNTER — Other Ambulatory Visit: Payer: Self-pay

## 2019-04-21 ENCOUNTER — Emergency Department (HOSPITAL_COMMUNITY)
Admission: EM | Admit: 2019-04-21 | Discharge: 2019-04-21 | Disposition: A | Discharge status: Home / Self Care | Payer: Medicaid Other | Attending: Emergency Medicine | Admitting: Emergency Medicine

## 2019-04-21 ENCOUNTER — Encounter (HOSPITAL_COMMUNITY): Payer: Self-pay | Admitting: Emergency Medicine

## 2019-04-21 DIAGNOSIS — Z79899 Other long term (current) drug therapy: Secondary | ICD-10-CM | POA: Insufficient documentation

## 2019-04-21 DIAGNOSIS — S161XXA Strain of muscle, fascia and tendon at neck level, initial encounter: Secondary | ICD-10-CM | POA: Insufficient documentation

## 2019-04-21 DIAGNOSIS — M5412 Radiculopathy, cervical region: Secondary | ICD-10-CM | POA: Insufficient documentation

## 2019-04-21 DIAGNOSIS — X58XXXA Exposure to other specified factors, initial encounter: Secondary | ICD-10-CM | POA: Insufficient documentation

## 2019-04-21 DIAGNOSIS — M62838 Other muscle spasm: Secondary | ICD-10-CM | POA: Insufficient documentation

## 2019-04-21 DIAGNOSIS — Z87891 Personal history of nicotine dependence: Secondary | ICD-10-CM | POA: Insufficient documentation

## 2019-04-21 DIAGNOSIS — Y929 Unspecified place or not applicable: Secondary | ICD-10-CM | POA: Insufficient documentation

## 2019-04-21 DIAGNOSIS — Y939 Activity, unspecified: Secondary | ICD-10-CM | POA: Insufficient documentation

## 2019-04-21 DIAGNOSIS — Y999 Unspecified external cause status: Secondary | ICD-10-CM | POA: Insufficient documentation

## 2019-04-21 LAB — CBC
HCT: 43.1 % (ref 36.0–46.0)
Hemoglobin: 14.8 g/dL (ref 12.0–15.0)
MCH: 29.2 pg (ref 26.0–34.0)
MCHC: 34.3 g/dL (ref 30.0–36.0)
MCV: 85.2 fL (ref 80.0–100.0)
Platelets: 346 10*3/uL (ref 150–400)
RBC: 5.06 MIL/uL (ref 3.87–5.11)
RDW: 12.6 % (ref 11.5–15.5)
WBC: 9.7 10*3/uL (ref 4.0–10.5)
nRBC: 0 % (ref 0.0–0.2)

## 2019-04-21 LAB — BASIC METABOLIC PANEL
Anion gap: 9 (ref 5–15)
BUN: 15 mg/dL (ref 6–20)
CO2: 21 mmol/L — ABNORMAL LOW (ref 22–32)
Calcium: 9.2 mg/dL (ref 8.9–10.3)
Chloride: 108 mmol/L (ref 98–111)
Creatinine, Ser: 0.55 mg/dL (ref 0.44–1.00)
GFR calc Af Amer: >60 mL/min (ref >60)
GFR calc non Af Amer: >60 mL/min (ref >60)
Glucose, Bld: 85 mg/dL (ref 70–99)
Potassium: 3.7 mmol/L (ref 3.5–5.1)
Sodium: 138 mmol/L (ref 135–145)

## 2019-04-21 LAB — I-STAT BETA HCG BLOOD, ED (MC, WL, AP ONLY): I-stat hCG, quantitative: <5 m[IU]/mL (ref <5)

## 2019-04-21 MED ORDER — DIAZEPAM 5 MG PO TABS
5.0000 mg | ORAL_TABLET | Freq: Once | ORAL | Status: AC
  Administered 2019-04-21: 5 mg via ORAL
  Filled 2019-04-21: qty 1

## 2019-04-21 MED ORDER — DEXAMETHASONE SODIUM PHOSPHATE 10 MG/ML IJ SOLN
10.0000 mg | Freq: Once | INTRAMUSCULAR | Status: AC
  Administered 2019-04-21: 10 mg via INTRAMUSCULAR
  Filled 2019-04-21: qty 1

## 2019-04-21 MED ORDER — KETOROLAC TROMETHAMINE 30 MG/ML IJ SOLN
30.0000 mg | Freq: Once | INTRAMUSCULAR | Status: AC
  Administered 2019-04-21: 30 mg via INTRAMUSCULAR
  Filled 2019-04-21: qty 1

## 2019-04-21 MED ORDER — DIAZEPAM 5 MG PO TABS: 5.0000 mg | ORAL_TABLET | Freq: Two times a day (BID) | ORAL | 0 refills | Status: DC

## 2019-04-21 MED ORDER — PREDNISONE 10 MG (21) PO TBPK: ORAL_TABLET | ORAL | 0 refills | Status: DC

## 2019-04-21 MED ORDER — HYDROCODONE-ACETAMINOPHEN 5-325 MG PO TABS: 1.0000 | ORAL_TABLET | ORAL | 0 refills | Status: DC | PRN

## 2019-04-21 NOTE — ED Triage Notes (Signed)
Pt reports having a HA, neck and back pain for about 3 days. Reports when she is laying back, the pain is worse. No relief with tylenol or ibuprofen. States she had a fall at work today.

## 2019-04-21 NOTE — ED Provider Notes (Signed)
[facility] EMERGENCY DEPARTMENT Provider Note   CSN: [id number] Arrival date & time: 04/21/19  1539     History   Chief Complaint Chief Complaint  Patient presents with   Neck Pain   Back Pain    HPI Kathy Walker is a 23 y.o. female.     Pt presents to the ED today with neck and headache.  The pt said it's been hurting for a few days.  The pt has pain radiating down her left arm.  She was here in July with something similar after "popping" her neck.  The pt denies any f/c.     Past Medical History:  Diagnosis Date   MDD (major depressive disorder), recurrent episode, moderate ([facility]) 12/31/2012   Post traumatic stress disorder (PTSD) 12/31/2012    Patient Active Problem List   Diagnosis Date Noted   Labial abscess 03/13/2019   Contraception management 03/13/2019   Well woman exam 03/13/2019   BV (bacterial vaginosis) 03/13/2019   Indication for care in labor or delivery 05/23/2016   Encounter for supervision of normal pregnancy in multigravida 12/19/2015   Teen pregnancy 12/19/2015    Past Surgical History:  Procedure Laterality Date   NO PAST SURGERIES       OB History    Gravida  2   Para  2   Term  2   Preterm  0   AB  0   Living  2     SAB  0   TAB  0   Ectopic  0   Multiple  0   Live Births  2            Home Medications    Prior to Admission medications   Medication Sig Start Date End Date Taking? Authorizing Provider  acetaminophen (TYLENOL) 325 MG tablet Take 325 mg by mouth every 6 (six) hours as needed for mild pain.    [provider]  diazepam (VALIUM) 5 MG tablet Take 1 tablet (5 mg total) by mouth 2 (two) times daily. 04/21/19   Isla Pence, MD  dicyclomine (BENTYL) 20 MG tablet Take 1 tablet (20 mg total) by mouth 2 (two) times daily. Patient not taking: Reported on 03/11/2018 10/09/16   Barnet Glasgow, NP  HYDROcodone-acetaminophen (NORCO/VICODIN) 5-325 MG tablet Take 1  tablet by mouth every 4 (four) hours as needed. 04/21/19   Isla Pence, MD  ibuprofen (ADVIL) 200 MG tablet Take 200 mg by mouth every 6 (six) hours as needed for moderate pain.    [provider]  ibuprofen (ADVIL,MOTRIN) 800 MG tablet Take 1 tablet (800 mg total) by mouth every 8 (eight) hours as needed. Patient not taking: Reported on 11/16/2018 03/11/18   Shelly Bombard, MD  medroxyPROGESTERone (DEPO-PROVERA) 150 MG/ML injection Inject 1 mL (150 mg total) into the muscle every 3 (three) months. Patient not taking: Reported on 03/11/2018 06/29/16   Shelly Bombard, MD  metroNIDAZOLE (FLAGYL) 500 MG tablet Take 1 tablet (500 mg total) by mouth 2 (two) times daily. Patient not taking: Reported on 11/16/2018 03/11/18   Shelly Bombard, MD  omeprazole (PRILOSEC) 40 MG capsule Take 1 capsule (40 mg total) by mouth 2 (two) times daily. Patient not taking: Reported on 11/16/2018 10/09/16 10/23/16  Barnet Glasgow, NP  ondansetron (ZOFRAN ODT) 4 MG disintegrating tablet Take 1 tablet (4 mg total) by mouth every 8 (eight) hours as needed for nausea or vomiting. Patient not taking: Reported on 03/11/2018 10/09/16  Dorena Bodo, NP  oxyCODONE-acetaminophen (PERCOCET/ROXICET) 5-325 MG tablet Take 1 tablet by mouth every 6 (six) hours as needed for severe pain. Patient not taking: Reported on 07/02/2016 05/30/16   Kathlene Cote  [facility] 1/35 tablet Take 1 tablet by mouth daily. 03/13/19   Venora Maples, MD  predniSONE (STERAPRED UNI-PAK 21 TAB) 10 MG (21) TBPK tablet Take 6 tabs for 2 days, then 5 for 2 days, then 4 for 2 days, then 3 for 2 days, 2 for 2 days, then 1 for 2 days 04/21/19   Jacalyn Lefevre, MD  Prenatal Multivit-Min-Fe-FA (PRENATAL VITAMINS) 0.8 MG tablet Take 1 tablet by mouth daily. Patient not taking: Reported on 09/27/2016 11/24/15   Tereso Newcomer, MD    Family History No family history on file.  Social History Social History   Tobacco Use   Smoking  status: Former Smoker   Smokeless tobacco: Never Used  Substance Use Topics   Alcohol use: No   Drug use: Yes     Allergies   Cherry and Raspberry   Review of Systems Review of Systems  Musculoskeletal: Positive for neck pain.  All other systems reviewed and are negative.    Physical Exam Updated Vital Signs BP (!) 175/91    Pulse 97    Temp 98.4 F (36.9 C) (Oral)    Resp 20    Ht 5\' 4"  (1.626 m)    Wt 79.4 kg    SpO2 100%    BMI 30.04 kg/m   Physical Exam Vitals signs and nursing note reviewed.  Constitutional:      Appearance: Normal appearance.  HENT:     Head: Normocephalic and atraumatic.     Right Ear: External ear normal.     Left Ear: External ear normal.     Nose: Nose normal.     Mouth/Throat:     Mouth: Mucous membranes are moist.     Pharynx: Oropharynx is clear.  Eyes:     Extraocular Movements: Extraocular movements intact.     Conjunctiva/sclera: Conjunctivae normal.     Pupils: Pupils are equal, round, and reactive to light.  Neck:      Comments: Muscle spasm in neck and spasm in left arm Cardiovascular:     Rate and Rhythm: Normal rate and regular rhythm.     Pulses: Normal pulses.     Heart sounds: Normal heart sounds.  Pulmonary:     Effort: Pulmonary effort is normal.     Breath sounds: Normal breath sounds.  Abdominal:     General: Abdomen is flat. Bowel sounds are normal.     Palpations: Abdomen is soft.  Musculoskeletal: Normal range of motion.  Skin:    General: Skin is warm.     Capillary Refill: Capillary refill takes less than 2 seconds.  Neurological:     General: No focal deficit present.     Mental Status: She is alert and oriented to person, place, and time.  Psychiatric:        Mood and Affect: Mood normal.        Behavior: Behavior normal.        Thought Content: Thought content normal.        Judgment: Judgment normal.      ED Treatments / Results  Labs (all labs ordered are listed, but only abnormal results  are displayed) Labs Reviewed  BASIC METABOLIC PANEL - Abnormal; Notable for the following components:      Result Value  CO2 21 (*)    All other components within normal limits  CBC  I-STAT BETA HCG BLOOD, ED ([facility], [facility], [facility] ONLY)    EKG None  Radiology Dg Cervical Spine Complete  Result Date: 04/21/2019 CLINICAL DATA:  Neck pain after fall today. EXAM: CERVICAL SPINE - COMPLETE 4+ VIEW COMPARISON:  None. FINDINGS: There is no evidence of cervical spine fracture or prevertebral soft tissue swelling. Alignment is normal. No other significant bone abnormalities are identified. IMPRESSION: Negative cervical spine radiographs. Electronically Signed   By: Lupita RaiderJames  Green Jr M.D.   On: 04/21/2019 17:22    Procedures Procedures (including critical care time)  Medications Ordered in ED Medications  diazepam (VALIUM) tablet 5 mg (5 mg Oral Given 04/21/19 1722)  dexamethasone (DECADRON) injection 10 mg (10 mg Intramuscular Given 04/21/19 1722)  ketorolac (TORADOL) 30 MG/ML injection 30 mg (30 mg Intramuscular Given 04/21/19 1722)     Initial Impression / Assessment and Plan / ED Course  I have reviewed the triage vital signs and the nursing notes.  Pertinent labs & imaging results that were available during my care of the patient were reviewed by me and considered in my medical decision making (see chart for details).     Pt feels much better.  She knows to return if worse.  Final Clinical Impressions(s) / ED Diagnoses   Final diagnoses:  Strain of neck muscle, initial encounter  Cervical radiculopathy    ED Discharge Orders         Ordered    predniSONE (STERAPRED UNI-PAK 21 TAB) 10 MG (21) TBPK tablet     04/21/19 1734    diazepam (VALIUM) 5 MG tablet  2 times daily     04/21/19 1734    HYDROcodone-acetaminophen (NORCO/VICODIN) 5-325 MG tablet  Every 4 hours PRN     04/21/19 1734           Jacalyn LefevreHaviland, Frimet Durfee, MD 04/21/19 2228

## 2019-04-22 ENCOUNTER — Telehealth: Payer: Self-pay | Admitting: *Deleted

## 2019-04-22 NOTE — Telephone Encounter (Signed)
Pharmacy called related to Rx: Prednisone packet too expensive .Marland KitchenMarland KitchenEDCM clarified with EDP  to change Rx to: loose tablets as it is more affordable.

## 2019-11-23 ENCOUNTER — Telehealth: Payer: Self-pay

## 2019-11-23 NOTE — Telephone Encounter (Signed)
Return call to pt to make her aware to make appt to discuss irregular bleeding with birth control Pt not ava left detailed vm.

## 2019-11-23 NOTE — Telephone Encounter (Signed)
Pt called back to confirm she missed 4 days on her BCP's  Pt discussed STD testing Pt wanted to wait to see if bleeding stops will continue pills as advised and call office back for appt. Pt mentioned insurance cost etc . Pt will call back if she needs appt if bleeding does not stop.

## 2019-11-30 ENCOUNTER — Ambulatory Visit: Payer: Medicaid Other | Admitting: Obstetrics

## 2019-12-03 ENCOUNTER — Telehealth: Payer: Self-pay

## 2019-12-03 NOTE — Telephone Encounter (Signed)
Patient called with c/o brownish vaginal discharge for 1 day, no other symptoms. I advised pt this could be left over blood from when she missed a couple OCP's recently and it caused her to bleed. I advised pt that if she would like to come in for evaluation we could schedule an appointment for her. Pt declined and [facility]

## 2020-01-08 ENCOUNTER — Encounter (HOSPITAL_COMMUNITY): Payer: Self-pay | Admitting: Emergency Medicine

## 2020-01-08 ENCOUNTER — Emergency Department (HOSPITAL_COMMUNITY)
Admission: EM | Admit: 2020-01-08 | Discharge: 2020-01-09 | Disposition: A | Discharge status: Home / Self Care | Payer: Medicaid Other | Attending: Emergency Medicine | Admitting: Emergency Medicine

## 2020-01-08 DIAGNOSIS — M436 Torticollis: Secondary | ICD-10-CM | POA: Insufficient documentation

## 2020-01-08 DIAGNOSIS — M542 Cervicalgia: Secondary | ICD-10-CM | POA: Insufficient documentation

## 2020-01-08 DIAGNOSIS — Z5321 Procedure and treatment not carried out due to patient leaving prior to being seen by health care provider: Secondary | ICD-10-CM | POA: Insufficient documentation

## 2020-01-08 DIAGNOSIS — M5412 Radiculopathy, cervical region: Secondary | ICD-10-CM | POA: Insufficient documentation

## 2020-01-08 NOTE — ED Notes (Signed)
Pt states she took tylenol, motrin and asa PTA

## 2020-01-08 NOTE — ED Triage Notes (Signed)
Pt c/o neck pain and stiffness, pt reports pain similar to pain she experienced with prior with "pinched nerve".  Pt states all movement causes radiating to head and back.

## 2020-01-09 NOTE — ED Notes (Signed)
PT stated she does not want to wait anymore and will come another day. PT has left AMA

## 2020-02-07 ENCOUNTER — Other Ambulatory Visit: Payer: Self-pay | Admitting: Family Medicine

## 2020-02-07 DIAGNOSIS — Z3041 Encounter for surveillance of contraceptive pills: Secondary | ICD-10-CM

## 2020-02-11 ENCOUNTER — Other Ambulatory Visit: Payer: Self-pay

## 2020-02-11 DIAGNOSIS — Z3041 Encounter for surveillance of contraceptive pills: Secondary | ICD-10-CM

## 2020-02-11 MED ORDER — PIRMELLA 1/35 1-35 MG-MCG PO TABS: 1.0000 | ORAL_TABLET | Freq: Every day | ORAL | 0 refills | Status: DC

## 2020-02-11 NOTE — Addendum Note (Signed)
Addended by: Dalphine Handing on: 02/11/2020 03:59 PM   Modules accepted: Orders

## 2020-02-11 NOTE — Progress Notes (Signed)
TC from pt reports she called the after hours line requesting BCP refill but hasn't heard from anyone yet.  On 03/13/2019, we sent 11 refills so she should still have one rf left on file at [facility]. I contacted [facility] on [address] They confirmed no refills on file and the patient was given (2) refills in July - no explanation given. One refill sent to the pharmacy Pt is aware to schedule for an annual visit after 03/12/2020 for further rf's.

## 2020-02-11 NOTE — Telephone Encounter (Signed)
Pt notified BCP rf sent to [facility] on [address]

## 2020-03-10 ENCOUNTER — Other Ambulatory Visit: Payer: Self-pay | Admitting: Obstetrics & Gynecology

## 2020-03-10 DIAGNOSIS — Z3041 Encounter for surveillance of contraceptive pills: Secondary | ICD-10-CM

## 2020-03-15 ENCOUNTER — Other Ambulatory Visit: Payer: Self-pay

## 2020-03-23 ENCOUNTER — Other Ambulatory Visit: Payer: Self-pay

## 2020-03-23 ENCOUNTER — Telehealth: Payer: Self-pay | Admitting: General Practice

## 2020-03-23 ENCOUNTER — Encounter: Payer: Self-pay | Admitting: Nurse Practitioner

## 2020-03-23 ENCOUNTER — Ambulatory Visit (INDEPENDENT_AMBULATORY_CARE_PROVIDER_SITE_OTHER): Payer: Medicaid Other | Admitting: Nurse Practitioner

## 2020-03-23 VITALS — BP 131/91 | HR 81 | Ht 64.0 in | Wt 192.0 lb

## 2020-03-23 DIAGNOSIS — Z3041 Encounter for surveillance of contraceptive pills: Secondary | ICD-10-CM

## 2020-03-23 DIAGNOSIS — R030 Elevated blood-pressure reading, without diagnosis of hypertension: Secondary | ICD-10-CM

## 2020-03-23 DIAGNOSIS — Z01419 Encounter for gynecological examination (general) (routine) without abnormal findings: Secondary | ICD-10-CM | POA: Diagnosis not present

## 2020-03-23 LAB — POCT URINE PREGNANCY: Preg Test, Ur: NEGATIVE

## 2020-03-23 MED ORDER — NORETHIN ACE-ETH ESTRAD-FE 1.5-30 MG-MCG PO TABS: 1.0000 | ORAL_TABLET | Freq: Every day | ORAL | 2 refills | Status: DC

## 2020-03-23 NOTE — Progress Notes (Signed)
GYNECOLOGY ANNUAL PREVENTATIVE CARE ENCOUNTER NOTE  Subjective:   Kathy Walker is a 24 y.o. [phone] female here for a routine annual gynecologic exam.  Current complaints: possibly interested in BTL.   Denies abnormal vaginal bleeding, discharge, pelvic pain, problems with intercourse or other gynecologic concerns.    Gynecologic History No LMP recorded. Contraception: OCP (estrogen/progesterone) Last Pap: 03/13/19. Results were: normal  Obstetric History OB History  Gravida Para Term Preterm AB Living  2 2 2  0 0 2  SAB TAB Ectopic Multiple Live Births  0 0 0 0 2    # Outcome Date GA Lbr Len/2nd Weight Sex Delivery Anes PTL Lv  2 Term 05/23/16 [redacted]w[redacted]d 18:55 / 00:02 7 lb 12 oz (3.515 kg) F Vag-Spont None  LIV  1 Term 02/09/15 [redacted]w[redacted]d  8 lb 2 oz (3.685 kg) F Vag-Spont  N LIV    Past Medical History:  Diagnosis Date   MDD (major depressive disorder), recurrent episode, moderate ([facility]) 12/31/2012   Post traumatic stress disorder (PTSD) 12/31/2012    Past Surgical History:  Procedure Laterality Date   NO PAST SURGERIES      Current Outpatient Medications on File Prior to Visit  Medication Sig Dispense Refill   PIRMELLA 1/35 tablet Take 1 tablet by mouth once daily 28 tablet 0   acetaminophen (TYLENOL) 325 MG tablet Take 325 mg by mouth every 6 (six) hours as needed for mild pain.     ibuprofen (ADVIL) 200 MG tablet Take 200 mg by mouth every 6 (six) hours as needed for moderate pain.     medroxyPROGESTERone (DEPO-PROVERA) 150 MG/ML injection Inject 1 mL (150 mg total) into the muscle every 3 (three) months. (Patient not taking: Reported on 03/11/2018) 1 mL 3   No current facility-administered medications on file prior to visit.    Allergies  Allergen Reactions   Cherry Itching   Raspberry Itching    Social History   Socioeconomic History   Marital status: Single    Spouse name: Not on file   Number of children: Not on file   Years of education: Not on  file   Highest education level: Not on file  Occupational History   Not on file  Tobacco Use   Smoking status: Former Smoker   Smokeless tobacco: Never Used  03/13/2018 Use: Never used  Substance and Sexual Activity   Alcohol use: No   Drug use: Yes   Sexual activity: Yes    Partners: Male    Birth control/protection: None, Pill  Other Topics Concern   Not on file  Social History Narrative   ** Merged History Encounter **       Social Determinants of Health   Financial Resource Strain:    Difficulty of Paying Living Expenses: Not on file  Food Insecurity:    Worried About Building services engineer in the Last Year: Not on file   Programme researcher, broadcasting/film/video of Food in the Last Year: Not on file  Transportation Needs:    Lack of Transportation (Medical): Not on file   Lack of Transportation (Non-Medical): Not on file  Physical Activity:    Days of Exercise per Week: Not on file   Minutes of Exercise per Session: Not on file  Stress:    Feeling of Stress : Not on file  Social Connections:    Frequency of Communication with Friends and Family: Not on file   Frequency of Social Gatherings with Friends and  Family: Not on file   Attends Religious Services: Not on file   Active Member of Clubs or Organizations: Not on file   Attends Club or Organization Meetings: Not on file   Marital Status: Not on file  Intimate Partner Violence:    Fear of Current or Ex-Partner: Not on file   Emotionally Abused: Not on file   Physically Abused: Not on file   Sexually Abused: Not on file    History reviewed. No pertinent family history.  The following portions of the patient's history were reviewed and updated as appropriate: allergies, current medications, past family history, past medical history, past social history, past surgical history and problem list.  Review of Systems Pertinent items noted in HPI and remainder of comprehensive ROS otherwise negative.   Objective:   BP (!) 131/91    Pulse 81    Ht 5\' 4"  (1.626 m)    Wt 192 lb (87.1 kg)    BMI 32.96 kg/m  CONSTITUTIONAL: Well-developed, well-nourished female in no acute distress.  HENT:  Normocephalic, atraumatic, External right and left ear normal.  EYES: Conjunctivae and EOM are normal. Pupils are equal, round.  No scleral icterus.  NECK: Normal range of motion, supple, no masses.  Normal thyroid.  SKIN: Skin is warm and dry. No rash noted. Not diaphoretic. No erythema. No pallor. NEUROLOGIC: Alert and oriented to person, place, and time. Normal reflexes, muscle tone coordination. No cranial nerve deficit noted. PSYCHIATRIC: Normal mood and affect. Normal behavior. Normal judgment and thought content. CARDIOVASCULAR: Normal heart rate noted, regular rhythm RESPIRATORY: Clear to auscultation bilaterally. Effort and breath sounds normal, no problems with respiration noted. BREASTS: Symmetric in size. No masses, skin changes, nipple drainage, or lymphadenopathy. ABDOMEN: Soft, no distention noted.  No tenderness, rebound or guarding.  MUSCULOSKELETAL: Normal range of motion. No tenderness.  No cyanosis, clubbing, or edema.    Assessment and Plan:  1. Women's annual routine gynecological examination Advised to make appointment with MD if wanting BTL - advised it is permanent  2. Encounter for surveillance of contraceptive pills Changed pill from 35 mcg to 30 mcg pill to see if BP will be better Return in 2 months for BP check and pill refill  - POCT urine pregnancy  3. Elevated BP without diagnosis of hypertension  Routine preventative health maintenance measures emphasized. Please refer to After Visit Summary for other counseling recommendations.    , RN, MSN, NP-BC Nurse Practitioner, [facility] for [facility]

## 2020-03-23 NOTE — Telephone Encounter (Signed)
Charity application given to patient. 

## 2020-03-23 NOTE — Progress Notes (Signed)
Patient presents for Annual Exam.   STD Screening: Declined.   UPT : Negative.

## 2020-04-04 ENCOUNTER — Emergency Department (HOSPITAL_COMMUNITY)
Admission: EM | Admit: 2020-04-04 | Discharge: 2020-04-05 | Disposition: A | Discharge status: Home / Self Care | Payer: Medicaid Other | Attending: Emergency Medicine | Admitting: Emergency Medicine

## 2020-04-04 ENCOUNTER — Emergency Department (HOSPITAL_COMMUNITY): Payer: Medicaid Other

## 2020-04-04 ENCOUNTER — Other Ambulatory Visit: Payer: Self-pay

## 2020-04-04 DIAGNOSIS — M542 Cervicalgia: Secondary | ICD-10-CM | POA: Insufficient documentation

## 2020-04-04 DIAGNOSIS — X500XXA Overexertion from strenuous movement or load, initial encounter: Secondary | ICD-10-CM | POA: Insufficient documentation

## 2020-04-04 DIAGNOSIS — R519 Headache, unspecified: Secondary | ICD-10-CM | POA: Insufficient documentation

## 2020-04-04 DIAGNOSIS — Y9389 Activity, other specified: Secondary | ICD-10-CM | POA: Insufficient documentation

## 2020-04-04 DIAGNOSIS — Z87891 Personal history of nicotine dependence: Secondary | ICD-10-CM | POA: Insufficient documentation

## 2020-04-04 NOTE — ED Triage Notes (Signed)
Patient stated potentially injured neck 4 days ago when carrying 22 year old daughter on her shoulder and when she put her down, patient's neck was pulled and she heard a pop. Since then she has had neck pain, spasm, migraine headache and unable to sleep. Pain is unrelieved by OTC meds as well as ice and heat therapy.

## 2020-04-05 MED ORDER — DEXAMETHASONE SODIUM PHOSPHATE 10 MG/ML IJ SOLN
10.0000 mg | Freq: Once | INTRAMUSCULAR | Status: AC
  Administered 2020-04-05: 10 mg via INTRAMUSCULAR
  Filled 2020-04-05: qty 1

## 2020-04-05 MED ORDER — NAPROXEN 500 MG PO TABS: 500.0000 mg | ORAL_TABLET | Freq: Two times a day (BID) | ORAL | 0 refills | Status: DC

## 2020-04-05 MED ORDER — METHOCARBAMOL 500 MG PO TABS: 500.0000 mg | ORAL_TABLET | Freq: Two times a day (BID) | ORAL | 0 refills | Status: DC

## 2020-04-05 MED ORDER — METHOCARBAMOL 500 MG PO TABS
500.0000 mg | ORAL_TABLET | Freq: Once | ORAL | Status: AC
  Administered 2020-04-05: 500 mg via ORAL
  Filled 2020-04-05: qty 1

## 2020-04-05 MED ORDER — KETOROLAC TROMETHAMINE 60 MG/2ML IM SOLN
60.0000 mg | Freq: Once | INTRAMUSCULAR | Status: AC
  Administered 2020-04-05: 60 mg via INTRAMUSCULAR
  Filled 2020-04-05: qty 2

## 2020-04-05 NOTE — Discharge Instructions (Signed)
Take the prescribed medication as directed.  Can continue using heat on the neck if still feeling sore/stiff. Follow-up with your primary care doctor. Return to the ED for new or worsening symptoms.

## 2020-04-05 NOTE — ED Provider Notes (Signed)
[facility] EMERGENCY DEPARTMENT Provider Note   CSN: [id number] Arrival date & time: 04/04/20  1705     History Chief Complaint  Patient presents with   Neck Pain    Kathy Walker is a 24 y.o. female.  The history is provided by the patient and medical records.  Neck Pain   24 y.o. F with hx of PTSD, presenting to the ED with headache and neck pain.  States 4 days ago she had her daughter (who is autistic) riding on her shoulders and when she tried to put her down she pulled hard on her head and then suddenly let go and felt her neck "snap".  States she did feel and hear a pop in her neck.  States since then she has had migraine headache and persistent left sided neck pain extending into the shoulder.  Denies new numbness/weakness of arms/legs.  Denies confusion, changes in speech, dizziness, blurred vision, etc.  She did try taking OTC meds and some topical numbing spray with transient relief but states symptoms now worse.  Past Medical History:  Diagnosis Date   MDD (major depressive disorder), recurrent episode, moderate (HCC) 12/31/2012   Post traumatic stress disorder (PTSD) 12/31/2012    Patient Active Problem List   Diagnosis Date Noted   Labial abscess 03/13/2019   Contraception management 03/13/2019   Panic attacks 08/16/2014    Past Surgical History:  Procedure Laterality Date   NO PAST SURGERIES       OB History    Gravida  2   Para  2   Term  2   Preterm  0   AB  0   Living  2     SAB  0   TAB  0   Ectopic  0   Multiple  0   Live Births  2           No family history on file.  Social History   Tobacco Use   Smoking status: Former Smoker   Smokeless tobacco: Never Used  Building services engineer Use: Never used  Substance Use Topics   Alcohol use: No   Drug use: Yes    Home Medications Prior to Admission medications   Medication Sig Start Date End Date Taking? Authorizing Provider  acetaminophen  (TYLENOL) 325 MG tablet Take 325 mg by mouth every 6 (six) hours as needed for mild pain.    [provider]  ibuprofen (ADVIL) 200 MG tablet Take 200 mg by mouth every 6 (six) hours as needed for moderate pain.    [provider]  norethindrone-ethinyl estradiol-iron (JUNEL FE 1.5/30) 1.5-30 MG-MCG tablet Take 1 tablet by mouth daily. 03/23/20   Currie Paris, NP    Allergies    Valentino Saxon and Raspberry  Review of Systems   Review of Systems  Musculoskeletal: Positive for neck pain.  All other systems reviewed and are negative.   Physical Exam Updated Vital Signs BP 107/77 (BP Location: Left Arm)    Pulse 88    Temp 98.4 F (36.9 C) (Oral)    Resp 18    Ht 5\' 4"  (1.626 m)    Wt 90.3 kg    SpO2 100%    BMI 34.16 kg/m   Physical Exam Vitals and nursing note reviewed.  Constitutional:      General: She is not in acute distress.    Appearance: She is well-developed. She is not diaphoretic.  HENT:  Head: Normocephalic and atraumatic.     Right Ear: External ear normal.     Left Ear: External ear normal.  Eyes:     Conjunctiva/sclera: Conjunctivae normal.     Pupils: Pupils are equal, round, and reactive to light.  Neck:     Comments: No midline spinal tenderness, no deformity, does have reproducible pain and tenderness along left cervical paraspinal musculature, normal grips bilaterally, good sensation to BUE diffusely No rigidity Cardiovascular:     Rate and Rhythm: Normal rate and regular rhythm.     Heart sounds: Normal heart sounds. No murmur heard.   Pulmonary:     Effort: Pulmonary effort is normal. No respiratory distress.     Breath sounds: Normal breath sounds. No wheezing or rhonchi.  Abdominal:     General: Bowel sounds are normal.     Palpations: Abdomen is soft.     Tenderness: There is no abdominal tenderness. There is no guarding.  Musculoskeletal:        General: Normal range of motion.     Cervical back: Full passive range of motion  without pain, normal range of motion and neck supple. No rigidity.  Skin:    General: Skin is warm and dry.     Findings: No rash.  Neurological:     Mental Status: She is alert and oriented to person, place, and time.     Cranial Nerves: No cranial nerve deficit.     Sensory: No sensory deficit.     Motor: No tremor or seizure activity.     Comments: AAOx3, answering questions and following commands appropriately; equal strength UE and LE bilaterally; CN grossly intact; moves all extremities appropriately without ataxia; no focal neuro deficits or facial asymmetry appreciated  Psychiatric:        Behavior: Behavior normal.        Thought Content: Thought content normal.     ED Results / Procedures / Treatments   Labs (all labs ordered are listed, but only abnormal results are displayed) Labs Reviewed - No data to display  EKG None  Radiology DG Cervical Spine Complete  Result Date: 04/04/2020 CLINICAL DATA:  Neck pain EXAM: CERVICAL SPINE - COMPLETE 4+ VIEW COMPARISON:  X-ray cervical spine 04/21/2019. FINDINGS: On the lateral view the cervical spine is visualized to the level of C7. Straightening of the normal cervical lordosis likely due to positioning. Alignment is otherwise normal. Dens is well positioned between the lateral masses of C1. There is limited evaluation of the dens for acute fracture on the open-mouth view due to overlying osseous structures. No acute displaced fracture is detected. Cervical disc heights are preserved.No degenerative changes. No aggressive-appearing focal osseous lesions. No osseous neural foraminal stenosis. Pre-vertebral soft tissues are within normal limits. IMPRESSION: Negative cervical spine radiographs. Electronically Signed   By: Tish Frederickson M.D.   On: 04/04/2020 18:30    Procedures Procedures (including critical care time)  Medications Ordered in ED Medications  ketorolac (TORADOL) injection 60 mg (60 mg Intramuscular Given 04/05/20  0113)  dexamethasone (DECADRON) injection 10 mg (10 mg Intramuscular Given 04/05/20 0112)  methocarbamol (ROBAXIN) tablet 500 mg (500 mg Oral Given 04/05/20 0113)    ED Course  I have reviewed the triage vital signs and the nursing notes.  Pertinent labs & imaging results that were available during my care of the patient were reviewed by me and considered in my medical decision making (see chart for details).    MDM Rules/Calculators/A&P  24 year old female  here with left-sided neck pain for the past 4 days after her daughter pulled on her neck while riding on her shoulders.  She did feel a "pop".  Since then she has had worsening tension and spasm, particularly along the left side of her neck.  She is awake, alert, appropriately oriented.  No focal neurologic deficits.  Does have reproducible pain along the left cervical paraspinal musculature without midline step-off or deformity.  She has no rigidity, no focal deficits.  Cervical spine films obtained from triage and are negative.  Will treat with IM toradal, decadron, and robaxin.  Will reassess.  2:36 AM Patient feeling better after medications, states neck feels more lose and headache has resolved.  Remains without neurologic deficits.  Feel she is stable for discharge home with continued symptomatic care.  Close follow-up with PCP.  Return here for any new/acute changes.  Final Clinical Impression(s) / ED Diagnoses Final diagnoses:  Neck pain    Rx / DC Orders ED Discharge Orders         Ordered    methocarbamol (ROBAXIN) 500 MG tablet  2 times daily        04/05/20 0238    naproxen (NAPROSYN) 500 MG tablet  2 times daily with meals        04/05/20 0238           Garlon Hatchet, PA-C 04/05/20 1740    Shon Baton, MD 04/05/20 [phone]

## 2020-05-23 ENCOUNTER — Ambulatory Visit: Payer: Medicaid Other

## 2020-06-25 ENCOUNTER — Other Ambulatory Visit: Payer: Self-pay | Admitting: Nurse Practitioner

## 2020-06-28 ENCOUNTER — Other Ambulatory Visit: Payer: Self-pay

## 2020-06-28 DIAGNOSIS — Z3041 Encounter for surveillance of contraceptive pills: Secondary | ICD-10-CM

## 2020-06-28 MED ORDER — NORETHIN ACE-ETH ESTRAD-FE 1.5-30 MG-MCG PO TABS: 1.0000 | ORAL_TABLET | Freq: Every day | ORAL | 1 refills | Status: DC

## 2020-06-28 NOTE — Progress Notes (Signed)
Pt called regarding refill on BCP Pt made aware needs B/P check per notes and will get pill refill Pt was not able to make last appt  Refilled one time today amd made aware needs to make appt. Pt agreeable and voiced understanding.

## 2020-06-30 ENCOUNTER — Ambulatory Visit: Payer: Medicaid Other

## 2020-07-05 ENCOUNTER — Ambulatory Visit (INDEPENDENT_AMBULATORY_CARE_PROVIDER_SITE_OTHER): Payer: Self-pay

## 2020-07-05 ENCOUNTER — Other Ambulatory Visit: Payer: Self-pay

## 2020-07-05 DIAGNOSIS — Z0130 Encounter for examination of blood pressure without abnormal findings: Secondary | ICD-10-CM

## 2020-07-05 DIAGNOSIS — Z3041 Encounter for surveillance of contraceptive pills: Secondary | ICD-10-CM

## 2020-07-05 MED ORDER — NORETHIN ACE-ETH ESTRAD-FE 1.5-30 MG-MCG PO TABS: 1.0000 | ORAL_TABLET | Freq: Every day | ORAL | 9 refills | Status: DC

## 2020-07-05 NOTE — Progress Notes (Signed)
Patient presents for BP check due to change in birth control. Patient was taking Pirmella which was stopped and switched to Junel in 03/2020. BP was high at previous visit. BP today is normal. Refills sent to pharmacy to cover until annual which is due in November.

## 2020-08-03 ENCOUNTER — Ambulatory Visit: Payer: Medicaid Other | Admitting: Obstetrics and Gynecology

## 2020-09-02 ENCOUNTER — Emergency Department (HOSPITAL_COMMUNITY)
Admission: EM | Admit: 2020-09-02 | Discharge: 2020-09-02 | Disposition: A | Discharge status: Home / Self Care | Payer: Medicaid Other | Attending: Emergency Medicine | Admitting: Emergency Medicine

## 2020-09-02 ENCOUNTER — Encounter (HOSPITAL_COMMUNITY): Payer: Self-pay

## 2020-09-02 ENCOUNTER — Emergency Department (HOSPITAL_COMMUNITY): Payer: Medicaid Other

## 2020-09-02 DIAGNOSIS — J101 Influenza due to other identified influenza virus with other respiratory manifestations: Secondary | ICD-10-CM

## 2020-09-02 DIAGNOSIS — R000 Tachycardia, unspecified: Secondary | ICD-10-CM | POA: Insufficient documentation

## 2020-09-02 DIAGNOSIS — Z20822 Contact with and (suspected) exposure to covid-19: Secondary | ICD-10-CM | POA: Insufficient documentation

## 2020-09-02 DIAGNOSIS — J1089 Influenza due to other identified influenza virus with other manifestations: Secondary | ICD-10-CM | POA: Insufficient documentation

## 2020-09-02 DIAGNOSIS — Z87891 Personal history of nicotine dependence: Secondary | ICD-10-CM | POA: Insufficient documentation

## 2020-09-02 LAB — RESP PANEL BY RT-PCR (FLU A&B, COVID) ARPGX2
Influenza A by PCR: POSITIVE — AB
Influenza B by PCR: NEGATIVE
SARS Coronavirus 2 by RT PCR: NEGATIVE

## 2020-09-02 MED ORDER — ACETAMINOPHEN 325 MG PO TABS
650.0000 mg | ORAL_TABLET | Freq: Once | ORAL | Status: AC | PRN
  Administered 2020-09-02: 650 mg via ORAL
  Filled 2020-09-02: qty 2

## 2020-09-02 MED ORDER — HYDROCODONE-HOMATROPINE 5-1.5 MG/5ML PO SYRP
5.0000 mL | ORAL_SOLUTION | Freq: Four times a day (QID) | ORAL | 0 refills | Status: DC | PRN
Start: 2020-09-02 — End: 2021-03-13

## 2020-09-02 NOTE — ED Triage Notes (Signed)
Emergency Medicine Provider Triage Evaluation Note  Kathy Walker , a 25 y.o. female  was evaluated in triage.  Pt complains of cough, chest tightness, fever, sore throat.  Patient has had the symptoms symptoms have worsened over this time.  Denies any sick contacts.  Patient denies receiving vaccination for covid or influenza.    Review of Systems  Positive: Fever, productive cough, sore throat,  Negative: Nausea, vomiting, abdominal pain, diarrhea, SHOB  Physical Exam  BP (!) 161/103   Pulse (!) 115   Temp (!) 100.6 F (38.1 C) (Oral)   Resp 18   Ht 5\' 4"  (1.626 m)   Wt 90.3 kg   LMP 08/19/2020   BMI 34.17 kg/m  Gen:   Awake, no distress   HEENT:  Atraumatic, tonsils swollen no exudate Resp:  Normal effort Cardiac:  Normal rate  MSK:   Moves extremities without difficulty  Neuro:  Speech clear   Medical Decision Making  Medically screening exam initiated at 2:50 PM.  Appropriate orders placed.  Kathy Walker was informed that the remainder of the evaluation will be completed by another provider, this initial triage assessment does not replace that evaluation, and the importance of remaining in the ED until their evaluation is complete.  Clinical Impression   The patient appears stable so that the remainder of the work up may be completed by another provider.       Ernesto Rutherford, Haskel Schroeder 09/02/20 1452

## 2020-09-02 NOTE — ED Notes (Signed)
Patient verbalizes understanding of discharge instructions. Prescriptions reviewed. Opportunity for questioning and answers were provided. Armband removed by staff, pt discharged from ED ambulatory. Work note was included in discharge papers.

## 2020-09-02 NOTE — ED Triage Notes (Signed)
Pt reports cough, congestion, chest tightness, sore throat for the past 3 days. Fever, last tylenol was around 3 am. Denies n/v. Denies any covid or flu vaccines.

## 2020-09-02 NOTE — ED Provider Notes (Signed)
[facility] EMERGENCY DEPARTMENT Provider Note   CSN: [id number] Arrival date & time: 09/02/20  1423     History Chief Complaint  Patient presents with  . Cough  . Nasal Congestion    Kathy Walker is a 25 y.o. female.  The history is provided by the patient.  Cough Cough characteristics:  Productive Sputum characteristics:  Nondescript Onset quality:  Gradual Duration:  3 days Timing:  Intermittent Progression:  Worsening Chronicity:  New Context: not sick contacts   Relieved by:  Nothing Worsened by:  Nothing Ineffective treatments:  None tried Associated symptoms: chest pain, chills, fever, myalgias, shortness of breath, sinus congestion and sore throat   Associated symptoms: no ear pain and no rash        Past Medical History:  Diagnosis Date  . MDD (major depressive disorder), recurrent episode, moderate (HCC) 12/31/2012  . Post traumatic stress disorder (PTSD) 12/31/2012    Patient Active Problem List   Diagnosis Date Noted  . Labial abscess 03/13/2019  . Contraception management 03/13/2019  . Panic attacks 08/16/2014    Past Surgical History:  Procedure Laterality Date  . NO PAST SURGERIES       OB History    Gravida  2   Para  2   Term  2   Preterm  0   AB  0   Living  2     SAB  0   IAB  0   Ectopic  0   Multiple  0   Live Births  2           No family history on file.  Social History   Tobacco Use  . Smoking status: Former Games developer  . Smokeless tobacco: Never Used  Vaping Use  . Vaping Use: Never used  Substance Use Topics  . Alcohol use: No  . Drug use: Yes    Home Medications Prior to Admission medications   Medication Sig Start Date End Date Taking? Authorizing Provider  HYDROcodone-homatropine (HYCODAN) 5-1.5 MG/5ML syrup Take 5 mLs by mouth every 6 (six) hours as needed for cough. 09/02/20  Yes Koleen Distance, MD  acetaminophen (TYLENOL) 325 MG tablet Take 325 mg by mouth every 6 (six)  hours as needed for mild pain.    [provider]  ibuprofen (ADVIL) 200 MG tablet Take 200 mg by mouth every 6 (six) hours as needed for moderate pain.    [provider]  methocarbamol (ROBAXIN) 500 MG tablet Take 1 tablet (500 mg total) by mouth 2 (two) times daily. 04/05/20   Garlon Hatchet, PA-C  naproxen (NAPROSYN) 500 MG tablet Take 1 tablet (500 mg total) by mouth 2 (two) times daily with a meal. 04/05/20   Garlon Hatchet, PA-C  norethindrone-ethinyl estradiol-iron (JUNEL FE 1.5/30) 1.5-30 MG-MCG tablet Take 1 tablet by mouth daily. 07/05/20   Brock Bad, MD    Allergies    Valentino Saxon and Raspberry  Review of Systems   Review of Systems  Constitutional: Positive for chills and fever.  HENT: Positive for sore throat. Negative for ear pain.   Eyes: Negative for pain and visual disturbance.  Respiratory: Positive for cough and shortness of breath.   Cardiovascular: Positive for chest pain. Negative for palpitations.  Gastrointestinal: Negative for abdominal pain and vomiting.  Genitourinary: Negative for dysuria and hematuria.  Musculoskeletal: Positive for myalgias. Negative for arthralgias and back pain.  Skin: Negative for color change and rash.  Neurological: Negative  for seizures and syncope.  All other systems reviewed and are negative.   Physical Exam Updated Vital Signs BP 134/77   Pulse 100   Temp 98.1 F (36.7 C) (Oral)   Resp 16   Ht 5\' 4"  (1.626 m)   Wt 90.3 kg   LMP 08/19/2020   SpO2 100%   BMI 34.17 kg/m   Physical Exam Vitals and nursing note reviewed.  Constitutional:      General: She is not in acute distress.    Appearance: She is well-developed.  HENT:     Head: Normocephalic and atraumatic.  Eyes:     Conjunctiva/sclera: Conjunctivae normal.  Cardiovascular:     Rate and Rhythm: Regular rhythm. Tachycardia present.     Heart sounds: No murmur heard.   Pulmonary:     Effort: Pulmonary effort is normal. No respiratory  distress.     Breath sounds: Normal breath sounds.  Abdominal:     Palpations: Abdomen is soft.     Tenderness: There is no abdominal tenderness.  Musculoskeletal:     Cervical back: Neck supple.  Skin:    General: Skin is warm and dry.  Neurological:     Mental Status: She is alert.     ED Results / Procedures / Treatments   Labs (all labs ordered are listed, but only abnormal results are displayed) Labs Reviewed  RESP PANEL BY RT-PCR (FLU A&B, COVID) ARPGX2 - Abnormal; Notable for the following components:      Result Value   Influenza A by PCR POSITIVE (*)    All other components within normal limits    EKG None  Radiology DG Chest Portable 1 View  Result Date: 09/02/2020 CLINICAL DATA:  Cough congestion fever EXAM: PORTABLE CHEST 1 VIEW COMPARISON:  03/22/2011 FINDINGS: The heart size and mediastinal contours are within normal limits. Both lungs are clear. The visualized skeletal structures are unremarkable. IMPRESSION: No active disease. Electronically Signed   By: 13/12/2010 M.D.   On: 09/02/2020 16:14    Procedures Procedures   Medications Ordered in ED Medications  acetaminophen (TYLENOL) tablet 650 mg (650 mg Oral Given 09/02/20 1443)    ED Course  I have reviewed the triage vital signs and the nursing notes.  Pertinent labs & imaging results that were available during my care of the patient were reviewed by me and considered in my medical decision making (see chart for details).    MDM Rules/Calculators/A&P                          CLAUDETT BAYLY is well-appearing.  She is 3 days into her viral illness and tested positive for influenza A.  She is outside of the window for antiviral treatment but was counseled on symptomatic management.  I did write her a cough syrup.  She will be discharged home.  We did talk about the COVID-19 vaccine, and she is interested in obtaining it.  She will wait until she has recovered from influenza. Final Clinical  Impression(s) / ED Diagnoses Final diagnoses:  Influenza A    Rx / DC Orders ED Discharge Orders         Ordered    HYDROcodone-homatropine (HYCODAN) 5-1.5 MG/5ML syrup  Every 6 hours PRN        09/02/20 2028           2029, MD 09/02/20 2035

## 2020-11-07 ENCOUNTER — Ambulatory Visit: Payer: Medicaid Other | Admitting: Obstetrics

## 2020-12-11 ENCOUNTER — Emergency Department (HOSPITAL_COMMUNITY)
Admission: EM | Admit: 2020-12-11 | Discharge: 2020-12-11 | Disposition: A | Discharge status: Home / Self Care | Payer: Self-pay | Attending: Emergency Medicine | Admitting: Emergency Medicine

## 2020-12-11 ENCOUNTER — Emergency Department (HOSPITAL_COMMUNITY): Payer: Self-pay

## 2020-12-11 ENCOUNTER — Encounter (HOSPITAL_COMMUNITY): Payer: Self-pay | Admitting: Emergency Medicine

## 2020-12-11 ENCOUNTER — Other Ambulatory Visit: Payer: Self-pay

## 2020-12-11 DIAGNOSIS — H538 Other visual disturbances: Secondary | ICD-10-CM | POA: Insufficient documentation

## 2020-12-11 DIAGNOSIS — M542 Cervicalgia: Secondary | ICD-10-CM | POA: Insufficient documentation

## 2020-12-11 DIAGNOSIS — M549 Dorsalgia, unspecified: Secondary | ICD-10-CM | POA: Insufficient documentation

## 2020-12-11 DIAGNOSIS — Z5321 Procedure and treatment not carried out due to patient leaving prior to being seen by health care provider: Secondary | ICD-10-CM | POA: Insufficient documentation

## 2020-12-11 LAB — CBC WITH DIFFERENTIAL/PLATELET
Abs Immature Granulocytes: 0.02 10*3/uL (ref 0.00–0.07)
Basophils Absolute: 0 10*3/uL (ref 0.0–0.1)
Basophils Relative: 1 %
Eosinophils Absolute: 0.1 10*3/uL (ref 0.0–0.5)
Eosinophils Relative: 1 %
HCT: 44 % (ref 36.0–46.0)
Hemoglobin: 14.8 g/dL (ref 12.0–15.0)
Immature Granulocytes: 0 %
Lymphocytes Relative: 37 %
Lymphs Abs: 3.1 10*3/uL (ref 0.7–4.0)
MCH: 28.9 pg (ref 26.0–34.0)
MCHC: 33.6 g/dL (ref 30.0–36.0)
MCV: 85.9 fL (ref 80.0–100.0)
Monocytes Absolute: 0.5 10*3/uL (ref 0.1–1.0)
Monocytes Relative: 6 %
Neutro Abs: 4.7 10*3/uL (ref 1.7–7.7)
Neutrophils Relative %: 55 %
Platelets: 383 10*3/uL (ref 150–400)
RBC: 5.12 MIL/uL — ABNORMAL HIGH (ref 3.87–5.11)
RDW: 12.6 % (ref 11.5–15.5)
WBC: 8.4 10*3/uL (ref 4.0–10.5)
nRBC: 0 % (ref 0.0–0.2)

## 2020-12-11 LAB — COMPREHENSIVE METABOLIC PANEL
ALT: 16 U/L (ref 0–44)
AST: 15 U/L (ref 15–41)
Albumin: 3.4 g/dL — ABNORMAL LOW (ref 3.5–5.0)
Alkaline Phosphatase: 42 U/L (ref 38–126)
Anion gap: 9 (ref 5–15)
BUN: 17 mg/dL (ref 6–20)
CO2: 23 mmol/L (ref 22–32)
Calcium: 9 mg/dL (ref 8.9–10.3)
Chloride: 106 mmol/L (ref 98–111)
Creatinine, Ser: 0.72 mg/dL (ref 0.44–1.00)
GFR, Estimated: >60 mL/min (ref >60)
Glucose, Bld: 97 mg/dL (ref 70–99)
Potassium: 4.1 mmol/L (ref 3.5–5.1)
Sodium: 138 mmol/L (ref 135–145)
Total Bilirubin: 0.4 mg/dL (ref 0.3–1.2)
Total Protein: 6.7 g/dL (ref 6.5–8.1)

## 2020-12-11 LAB — I-STAT BETA HCG BLOOD, ED (MC, WL, AP ONLY): I-stat hCG, quantitative: <5 m[IU]/mL (ref <5)

## 2020-12-11 LAB — LIPASE, BLOOD: Lipase: 34 U/L (ref 11–51)

## 2020-12-11 NOTE — ED Triage Notes (Signed)
Pt reports pain to back and neck since bending over at work on Thursday.  States she is also having blurred vision.

## 2020-12-11 NOTE — ED Provider Notes (Signed)
Emergency Medicine Provider Triage Evaluation Note  Kathy Walker , a 25 y.o. female  was evaluated in triage.  Pt complains of HA, neck pain, emesis, blurred vision. Began 2 days ago. Bent over and felt a pop in upper back. Thoracic back pain resolved, next day developed pain to cervical region. Worse with neck movement. Some NBNB emesis. Blurred vision to BL eyes yesterday. No hx of aneurysm. No numbness, weakness Review of Systems  Positive: HA, emesis, neck pain Negative: Fever, chills, CP, SOB  Physical Exam  There were no vitals taken for this visit. Gen:   Awake, no distress   Neck:  Diffuse tenderness to left neck, limited ROM Resp:  Normal effort  MSK:   Moves extremities without difficulty  Other:  CN 2-12 grossly intact, no facial droop  Medical Decision Making  Medically screening exam initiated at 8:08 AM.  Appropriate orders placed.  AARION KITTRELL was informed that the remainder of the evaluation will be completed by another provider, this initial triage assessment does not replace that evaluation, and the importance of remaining in the ED until their evaluation is complete.  HA, blurred vision, neck pain  VS stable, work up started   [employer], Sharnay Cashion A, PA-C 12/11/20 0810    Margarita Grizzle, MD 12/11/20 [phone]

## 2020-12-11 NOTE — ED Notes (Signed)
Pt states she has to leave to care for her kids. Pulling OTF

## 2021-03-13 ENCOUNTER — Ambulatory Visit (INDEPENDENT_AMBULATORY_CARE_PROVIDER_SITE_OTHER): Payer: Medicaid Other | Admitting: Obstetrics and Gynecology

## 2021-03-13 ENCOUNTER — Other Ambulatory Visit: Payer: Self-pay

## 2021-03-13 ENCOUNTER — Other Ambulatory Visit (HOSPITAL_COMMUNITY)
Admission: RE | Admit: 2021-03-13 | Discharge: 2021-03-13 | Disposition: A | Discharge status: Home / Self Care | Payer: Medicaid Other | Source: Ambulatory Visit | Attending: Obstetrics and Gynecology | Admitting: Obstetrics and Gynecology

## 2021-03-13 ENCOUNTER — Encounter: Payer: Self-pay | Admitting: Obstetrics and Gynecology

## 2021-03-13 VITALS — BP 135/82 | HR 83 | Ht 64.0 in | Wt 190.0 lb

## 2021-03-13 DIAGNOSIS — Z3041 Encounter for surveillance of contraceptive pills: Secondary | ICD-10-CM

## 2021-03-13 DIAGNOSIS — Z01419 Encounter for gynecological examination (general) (routine) without abnormal findings: Secondary | ICD-10-CM | POA: Insufficient documentation

## 2021-03-13 NOTE — Progress Notes (Signed)
Subjective:     Kathy Walker is a 25 y.o. female P2 with LMP 03/05/21 and BMI 32 who is here for a comprehensive physical exam. The patient reports no problems. She reports a monthly 4-5 day period. She is sexually active using COC for contraception. She denies pelvic pain or abnormal discharge. She is without any other complaints. She expressed a desire for BTL in 3-4 years.  Past Medical History:  Diagnosis Date   MDD (major depressive disorder), recurrent episode, moderate (HCC) 12/31/2012   Post traumatic stress disorder (PTSD) 12/31/2012   Past Surgical History:  Procedure Laterality Date   NO PAST SURGERIES     No family history on file.  Social History   Socioeconomic History   Marital status: Single    Spouse name: Not on file   Number of children: Not on file   Years of education: Not on file   Highest education level: Not on file  Occupational History   Not on file  Tobacco Use   Smoking status: Former   Smokeless tobacco: Never  Vaping Use   Vaping Use: Never used  Substance and Sexual Activity   Alcohol use: No   Drug use: Yes   Sexual activity: Yes    Partners: Male    Birth control/protection: None, Pill  Other Topics Concern   Not on file  Social History Narrative   ** Merged History Encounter **       Social Determinants of Health   Financial Resource Strain: Not on file  Food Insecurity: Not on file  Transportation Needs: Not on file  Physical Activity: Not on file  Stress: Not on file  Social Connections: Not on file  Intimate Partner Violence: Not on file   Health Maintenance  Topic Date Due   COVID-19 Vaccine (1) Never done   HPV VACCINES (1 - 2-dose series) Never done   INFLUENZA VACCINE  12/12/2020   CHLAMYDIA SCREENING  03/23/2021 (Originally 03/12/2020)   Hepatitis C Screening  03/23/2021 (Originally 09/16/2015)   PAP-Cervical Cytology Screening  03/12/2022   PAP SMEAR-Modifier  03/12/2022   TETANUS/TDAP  03/16/2026   HIV Screening   Completed   Pneumococcal Vaccine 50-75 Years old  Aged Out       Review of Systems Pertinent items noted in HPI and remainder of comprehensive ROS otherwise negative.   Objective:  Blood pressure 135/82, pulse 83, height 5\' 4"  (1.626 m), weight 190 lb (86.2 kg), last menstrual period 03/05/2021.    GENERAL: Well-developed, well-nourished female in no acute distress.  HEENT: Normocephalic, atraumatic. Sclerae anicteric.  NECK: Supple. Normal thyroid.  LUNGS: Clear to auscultation bilaterally.  HEART: Regular rate and rhythm. BREASTS: Symmetric in size. No palpable masses or lymphadenopathy, skin changes, or nipple drainage. ABDOMEN: Soft, nontender, nondistended. No organomegaly. PELVIC: Normal external female genitalia. Vagina is pink and rugated.  Normal discharge. Normal appearing cervix. Uterus is normal in size. No adnexal mass or tenderness. Chaperone present during the pelvic exam EXTREMITIES: No cyanosis, clubbing, or edema, 2+ distal pulses.    Assessment:    Healthy female exam.      Plan:    Pap smear collected STI screening collected Patient will be contacted with abnormal results Refill on COC provided  Information provided on BTL and permanence of the procedure. LARCs reviewed as alternative option given young age See After Visit Summary for Counseling Recommendations

## 2021-03-14 LAB — HEPATITIS C ANTIBODY: Hep C Virus Ab: <0.1 s/co ratio (ref 0.0–0.9)

## 2021-03-14 LAB — HEPATITIS B SURFACE ANTIGEN: Hepatitis B Surface Ag: NEGATIVE

## 2021-03-14 LAB — CERVICOVAGINAL ANCILLARY ONLY
Chlamydia: NEGATIVE
Comment: NEGATIVE
Comment: NEGATIVE
Comment: NORMAL
Neisseria Gonorrhea: NEGATIVE
Trichomonas: NEGATIVE

## 2021-03-14 LAB — HIV ANTIBODY (ROUTINE TESTING W REFLEX): HIV Screen 4th Generation wRfx: NONREACTIVE

## 2021-03-14 LAB — RPR: RPR Ser Ql: NONREACTIVE

## 2021-03-16 LAB — CYTOLOGY - PAP
Comment: NEGATIVE
Diagnosis: UNDETERMINED — AB
High risk HPV: NEGATIVE

## 2021-05-08 ENCOUNTER — Other Ambulatory Visit: Payer: Self-pay | Admitting: Obstetrics

## 2021-05-08 DIAGNOSIS — Z3041 Encounter for surveillance of contraceptive pills: Secondary | ICD-10-CM

## 2021-07-11 IMAGING — DX DG CERVICAL SPINE COMPLETE 4+V
5 series · 5 of 5 positions shown · non-contrast
Comparison: X-ray cervical spine 04/21/2019.

CLINICAL DATA: Neck pain

EXAM:
CERVICAL SPINE - COMPLETE 4+ VIEW

[c-spine lat]
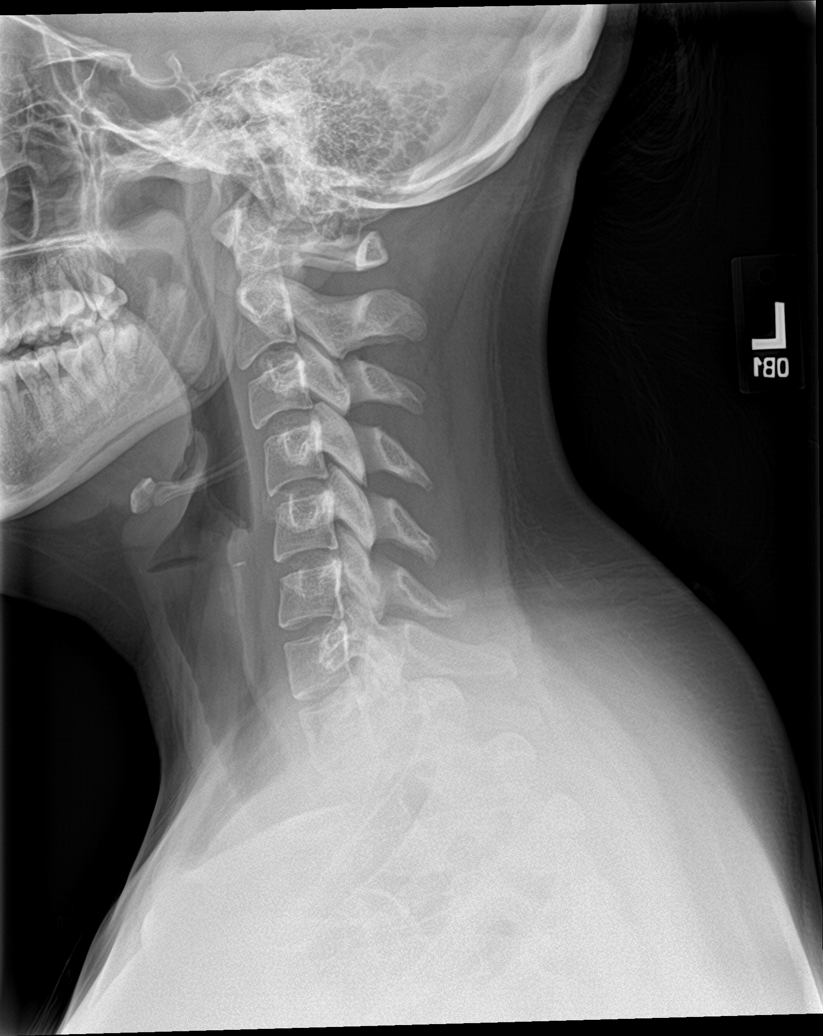

[c-spine obl (1 of 2)]
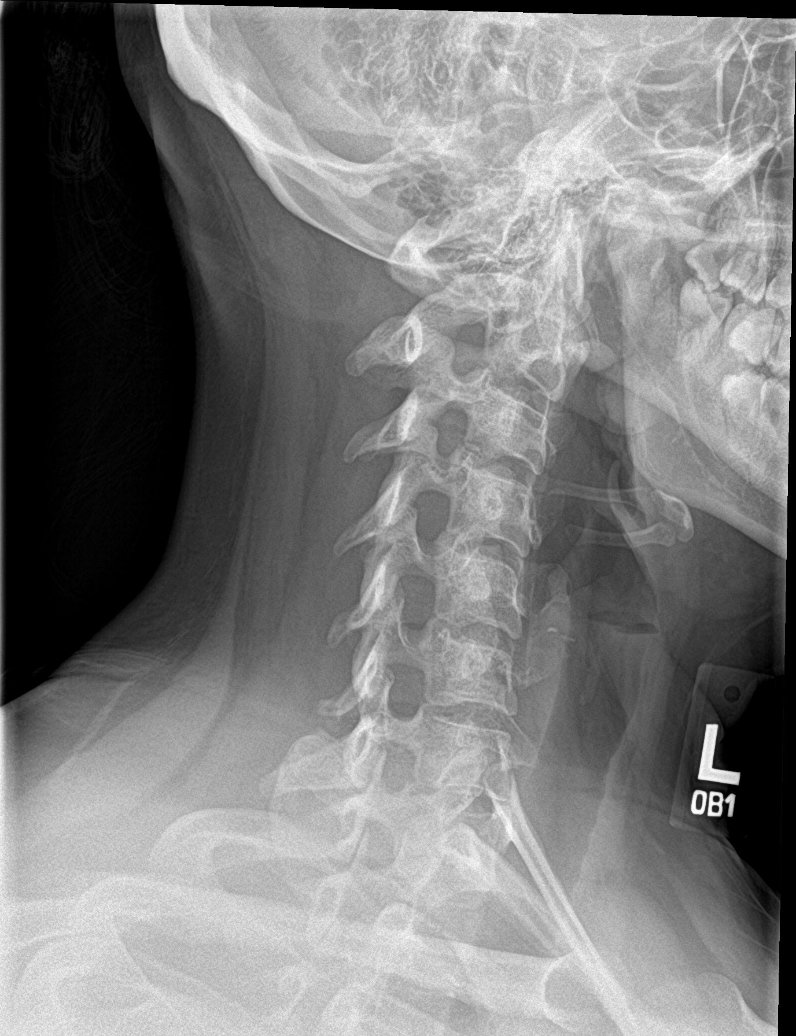

[c-spine obl (2 of 2)]
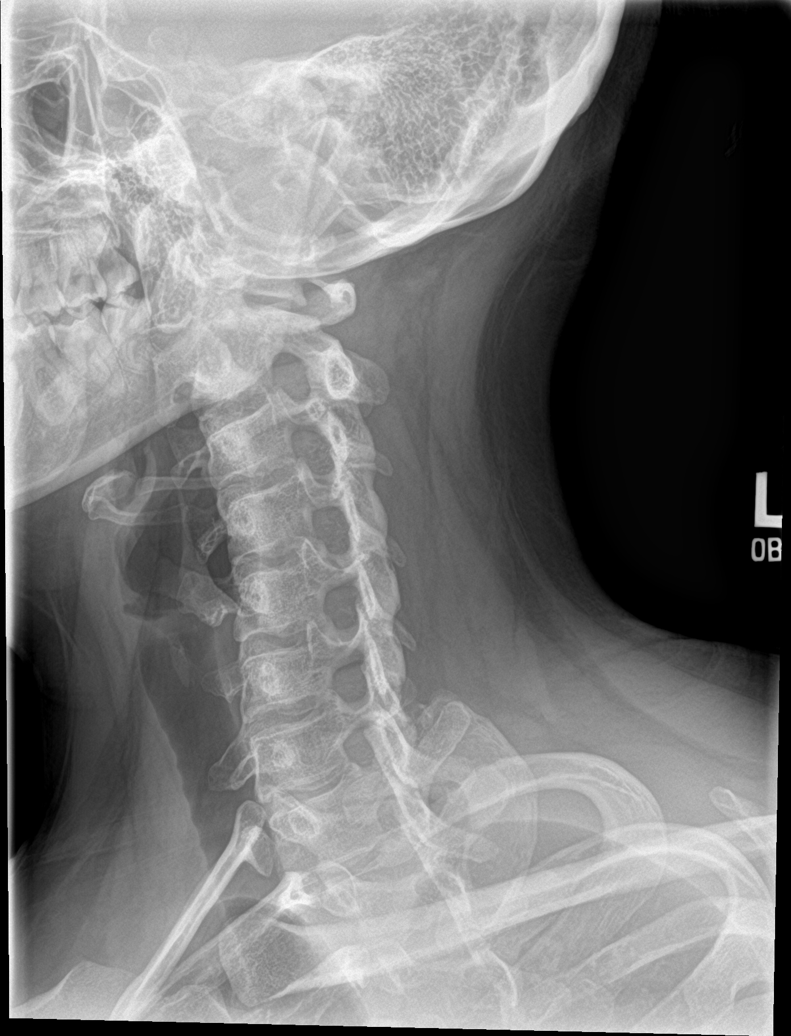

[c-spine ap]
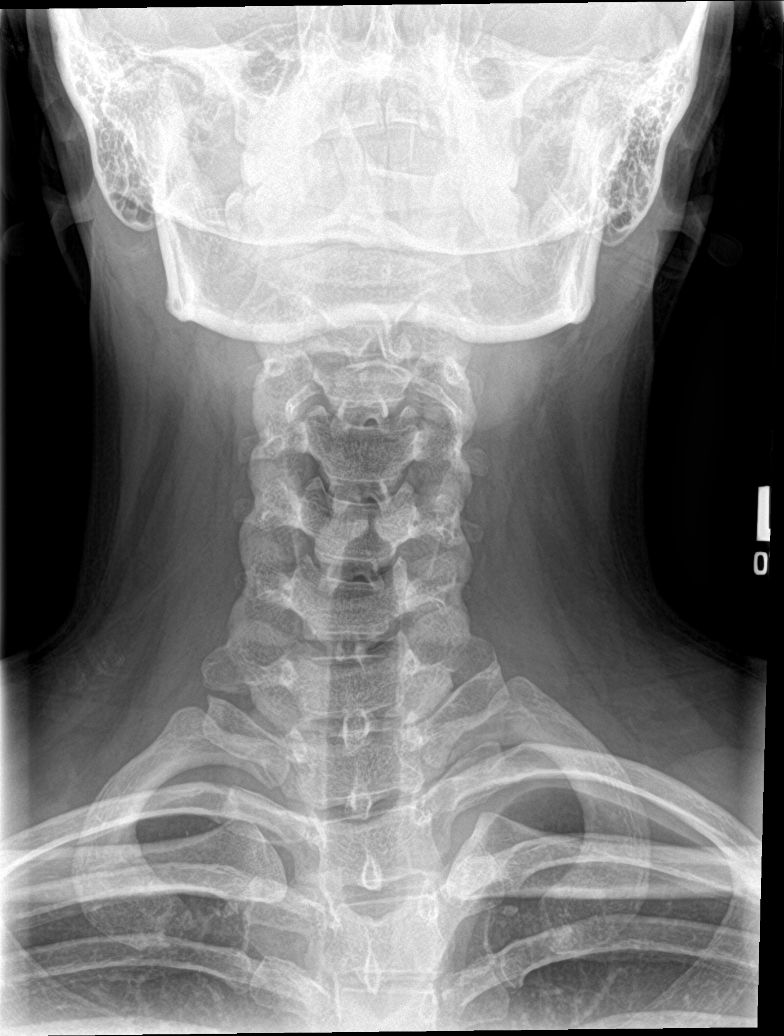

[c-spine open mouth]
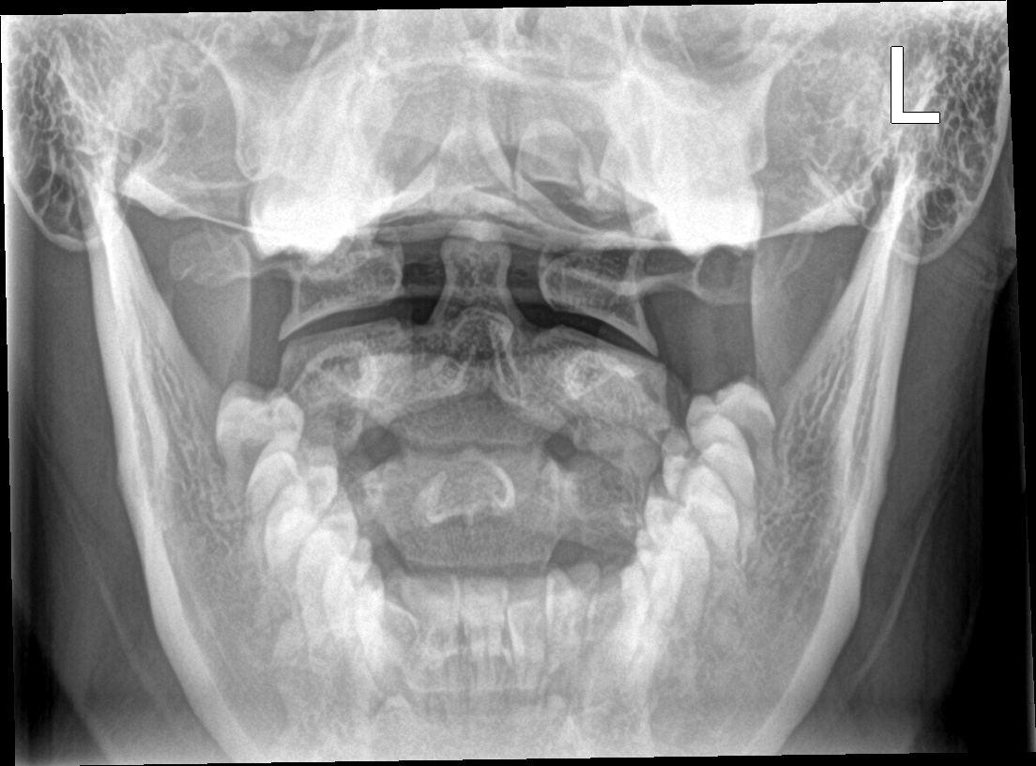

[5 of 5 positions shown; findings below may reference images not displayed]

FINDINGS: On the lateral view the cervical spine is visualized to the level of
C7. Straightening of the normal cervical lordosis likely due to
positioning. Alignment is otherwise normal.

Dens is well positioned between the lateral masses of C1. There is
limited evaluation of the dens for acute fracture on the open-mouth
view due to overlying osseous structures.

No acute displaced fracture is detected. Cervical disc heights are
preserved.No degenerative changes. No aggressive-appearing focal
osseous lesions. No osseous neural foraminal stenosis.

Pre-vertebral soft tissues are within normal limits.
IMPRESSION: Negative cervical spine radiographs.

## 2022-02-07 ENCOUNTER — Other Ambulatory Visit: Payer: Self-pay | Admitting: Obstetrics and Gynecology

## 2022-02-07 DIAGNOSIS — Z3041 Encounter for surveillance of contraceptive pills: Secondary | ICD-10-CM

## 2022-02-15 ENCOUNTER — Other Ambulatory Visit: Payer: Self-pay | Admitting: General Practice

## 2022-02-15 DIAGNOSIS — Z3041 Encounter for surveillance of contraceptive pills: Secondary | ICD-10-CM

## 2022-02-15 MED ORDER — NORETHIN ACE-ETH ESTRAD-FE 1.5-30 MG-MCG PO TABS: 1.0000 | ORAL_TABLET | Freq: Every day | ORAL | 1 refills | Status: DC

## 2022-02-15 NOTE — Progress Notes (Signed)
Pt requsted refill of Larin Fe. Two refills sent to get pt to AEX scheduled 04-04-22.

## 2022-04-04 ENCOUNTER — Other Ambulatory Visit (HOSPITAL_COMMUNITY)
Admission: RE | Admit: 2022-04-04 | Discharge: 2022-04-04 | Disposition: A | Discharge status: Home / Self Care | Payer: Medicaid Other | Source: Ambulatory Visit

## 2022-04-04 ENCOUNTER — Ambulatory Visit (INDEPENDENT_AMBULATORY_CARE_PROVIDER_SITE_OTHER): Payer: Medicaid Other

## 2022-04-04 VITALS — BP 138/86 | HR 102 | Ht 64.0 in | Wt 204.6 lb

## 2022-04-04 DIAGNOSIS — Z01419 Encounter for gynecological examination (general) (routine) without abnormal findings: Secondary | ICD-10-CM | POA: Insufficient documentation

## 2022-04-04 DIAGNOSIS — R87610 Atypical squamous cells of undetermined significance on cytologic smear of cervix (ASC-US): Secondary | ICD-10-CM

## 2022-04-04 DIAGNOSIS — N898 Other specified noninflammatory disorders of vagina: Secondary | ICD-10-CM

## 2022-04-04 DIAGNOSIS — Z113 Encounter for screening for infections with a predominantly sexual mode of transmission: Secondary | ICD-10-CM | POA: Diagnosis not present

## 2022-04-04 NOTE — Progress Notes (Signed)
Patient presents for AEX. Patient complains of having vaginal discharge and odor. Patient wants to discuss changes in appetite since starting ocp.   Last Pap: 03/13/21 ASCUS

## 2022-04-04 NOTE — Progress Notes (Signed)
GYNECOLOGY OFFICE VISIT NOTE-WELL WOMAN EXAM  History:   JAZMYNN PHO [id number] here today for annual exam. She states she has discharge that is normal, but worsens after sexual activity.  She states it is grayish-brownish color, without itching or irritation (unless sweating), and with strong odor. She states she does not use fragrance or douching.  She is worried about this being a STD. denies any abnormal vaginal discharge, bleeding, pelvic pain or other concerns.   Birth Control:  Oral Contraception, Doing well.   Reproductive Concerns Sexually Active: Yes Partners Type: Female Number of partners in last year: One STD Testing: Full  Vaginal/GU Concerns: Vaginal Discharge and odor Breast Concerns/Exams: She denies breast concerns.  She reports checking breast regularly. She endorses SBA. She denies family history of breast, uterine, cervical, or ovarian cancer  Medical and Nutrition PCP: None Significant PMx:  Exercise: None Tobacco/Drugs/Alcohol: Nutrition: Not Balanced  Social Safety at home: Lives with mother, husband, and two kids DV/A: None Social Support: Endorses Employment: Materials engineer -[employer]  Past Medical History:  Diagnosis Date   MDD (major depressive disorder), recurrent episode, moderate (HCC) 12/31/2012   Post traumatic stress disorder (PTSD) 12/31/2012    Past Surgical History:  Procedure Laterality Date   NO PAST SURGERIES      The following portions of the patient's history were reviewed and updated as appropriate: allergies, current medications, past family history, past medical history, past social history, past surgical history and problem list.   Health Maintenance:  Ascus pap and negative HRHPV on Oct 2022.  No mammogram on file d/t age.   Review of Systems:  Pertinent items noted in HPI and remainder of comprehensive ROS otherwise negative.    Objective:    Physical Exam BP 138/86   Pulse (!) 102   Ht 5\' 4"  (1.626 m)   Wt 204 lb  9.6 oz (92.8 kg)   LMP 03/05/2022   BMI 35.12 kg/m  Physical Exam Vitals reviewed.  Constitutional:      Appearance: Normal appearance.  HENT:     Head: Normocephalic and atraumatic.  Eyes:     Conjunctiva/sclera: Conjunctivae normal.  Neck:     Thyroid: No thyroid mass, thyromegaly or thyroid tenderness.  Cardiovascular:     Rate and Rhythm: Normal rate and regular rhythm.     Heart sounds: Normal heart sounds.  Pulmonary:     Effort: Pulmonary effort is normal. No respiratory distress.     Breath sounds: Normal breath sounds.  Abdominal:     General: Bowel sounds are normal.     Palpations: Abdomen is soft.     Tenderness: There is no abdominal tenderness.  Musculoskeletal:        General: Normal range of motion.     Cervical back: Normal range of motion.  Skin:    General: Skin is warm and dry.  Neurological:     Mental Status: She is alert and oriented to person, place, and time.  Psychiatric:        Mood and Affect: Mood normal.        Behavior: Behavior normal.      Labs and Imaging No results found for this or any previous visit (from the past 168 hour(s)). No results found.   Assessment & Plan:   26 year old female Well Woman Exam H/O ASCUS  Vaginal Discharge    1. Encounter for well woman exam -Exam performed and findings discussed. -Informed of turnover time and provider/clinic policy  on releasing results. -Encouraged to activate and utilize Mychart for reviewing of results, communication with office, and scheduling of appts. -Provider resets patient mychart password to allow for patient access.  -Encouraged to obtain an PCP. -Educated on AHA exercise recommendations of 30 minutes of moderate to vigorous activity at least 5x/week. -Educated and encouraged to continue with increased breast awareness including examination of breast for skin changes, moles, tenderness, etc.   - Cervicovaginal ancillary only( [place])  2. ASCUS of cervix with  negative high risk HPV -Educated on ASCCP guidelines regarding pap smear evaluation and frequency. -Discussed how since HPV negative, no pap necessary today.  Will plan to repeat in 2025.  - Cervicovaginal ancillary only( [place])  3. Vaginal discharge -Patient self swab. -Will treat accordingly - Cervicovaginal ancillary only( [place])   Routine preventative health maintenance measures emphasized. Please refer to After Visit Summary for other counseling recommendations.   No follow-ups on file.      Cherre Robins, CNM 04/04/2022

## 2022-04-04 NOTE — Patient Instructions (Signed)
AREA FAMILY PRACTICE PHYSICIANS  [place] ([id number]) [facility] [address] [place], [place] [address] [phone] Mon-Fri 8:30-12:30, 1:30-5:00 Accepting Medicaid [facility] [address], [place], [place] [address] [phone] Mon-Fri 8:00-5:30 [facility] [address] [place], [place] [address] [phone] Mon, Tue, Thur, Fri 8:30-5:00, Wed 10:00-7:00 (closed 1-2pm) Accepting Medicaid [facility] [address], [place], [place]  [address] Phone - [phone]   Fax - [phone]  [place] ([address]) [facility] [address] [place], [place] [address] [phone] Mon-Fri 8:00-5:00 [facility] ([facility])  [address] [place], [place] [address] [phone] Mon-Fri 8:30-5:30, Sat (Oct.-Mar.) 9:00-1:00 Accepting Medicaid  [place] ([address]) [facility] [address], [place], [place] [address] [phone] Mon-Fri 8:00-5:00  [place] ([address]) [facility] [address], [place], [place] [address] [phone] Mon-Fri 8:00-5:00 [facility] [address], [place], [place] [address] [phone] Mon-Fri 8:00-5:00 [facility] [address] [place], [place] [address] [phone] Mon-Fri 8:00-5:00 [facility] [address] [place] [address] [phone] Mon-Fri 7:30-5:30  [place] ([address] & [address]) [facility] [address] [place], [place] [address] [phone] Mon-Thur 8:00-6:00 Accepting Medicaid [facility] [address] [place], [place] [address] [phone] Mon-Thur 7:30-7:30, Fri 7:30-4:30 Accepting  Medicaid [facility] [address], [place], [place]  [address] [phone]   Fax - [phone]  [place] ([address] & [address]) [facility] [address] [place], [place] [address] [phone] Mon-Fri 7:00-5:00 [facility] [address], [place], [place] [address] [phone] Mon-Fri 8:00-5:00 Accepting Medicaid [facility] [address], , [place] [address] [phone] Mon-Fri 8:00-5:00 Accepting Medicaid  [place] ([address]) [facility] [place] [address] [place], [place] [address] [phone] Mon-Fri 8:00-5:00 [facility] [address], [place], [place] [address] [phone] Mon-Fri 8:00-5:00 Accepting Medicaid [facility] ([facility]) [address], [place], [place] [address] [phone] Mon-Fri 8:00-5:30, Sat&Sun by appointment (phones open at 8:30) Accepting Medicaid  [place] ([address]) [facility] [address] [place], [place] [address] [phone] Mon-Thur 8:00-7:00, Fri 8:00-5:00, Sat 8:00-12:00, Sun 9:00-12:00 Accepting Medicaid [facility] [address], [place], [place] [address] [phone] Mon-Thur 8:00-5:00 Accepting Medicaid [facility] [address] [place], [place] [address] [phone] Mon-Fri 8:00-5:30, Sat (Oct.-Mar.) 9:00-1:00 Accepting Medicaid  [place] ([address]) [facility] [address] [place] [address], [place], [place] [address] [phone] Mon-Fri 8:00-5:00 Accepting Medicaid   [place] ([address]) [facility] [place] [address], [place], [place] [address] [phone] Mon-Fri 8:00-5:00  [facility] [place] [address], [place], [place] [address] [phone] Mon-Fri 8:00-5:00 [facility] [place] [address] [place] [address], [place], [place] [address] [phone] Mon-Fri 8:00-5:00 After hours clinic ([address] [place],  [address]) [phone] Mon-Fri 5:00-8:00, Sat 12:00-6:00, Sun 10:00-4:00 Accepting Medicaid [facility] [place]   [address], [place], [place]  [address] [phone]   Fax - [phone]  [place] ([address]) [facility] [place] [address], [place], [place] [address] [phone] Mon-Fri 8:00-5:00 [facility] [place] ([facility] [place]) [address], [place], [place] [address] [phone] Mon-Thur 8:00-7:00, Fri 8:00-5:00, Sat 8:00-12:00    

## 2022-04-09 ENCOUNTER — Other Ambulatory Visit: Payer: Self-pay | Admitting: Obstetrics and Gynecology

## 2022-04-09 DIAGNOSIS — Z3041 Encounter for surveillance of contraceptive pills: Secondary | ICD-10-CM

## 2022-04-16 LAB — CERVICOVAGINAL ANCILLARY ONLY
Bacterial Vaginitis (gardnerella): POSITIVE — AB
Candida Glabrata: NEGATIVE
Candida Vaginitis: NEGATIVE
Chlamydia: NEGATIVE
Comment: NEGATIVE
Comment: NEGATIVE
Comment: NEGATIVE
Comment: NEGATIVE
Comment: NEGATIVE
Comment: NORMAL
Neisseria Gonorrhea: NEGATIVE
Trichomonas: NEGATIVE

## 2022-04-17 ENCOUNTER — Other Ambulatory Visit: Payer: Self-pay | Admitting: General Practice

## 2022-04-17 DIAGNOSIS — Z3041 Encounter for surveillance of contraceptive pills: Secondary | ICD-10-CM

## 2022-04-17 MED ORDER — NORETHIN ACE-ETH ESTRAD-FE 1.5-30 MG-MCG PO TABS: 1.0000 | ORAL_TABLET | Freq: Every day | ORAL | 11 refills | Status: DC

## 2022-04-19 ENCOUNTER — Telehealth: Payer: Self-pay | Admitting: Emergency Medicine

## 2022-04-19 ENCOUNTER — Other Ambulatory Visit: Payer: Self-pay | Admitting: Emergency Medicine

## 2022-04-19 MED ORDER — METRONIDAZOLE 500 MG PO TABS: 500.0000 mg | ORAL_TABLET | Freq: Two times a day (BID) | ORAL | 0 refills | Status: AC

## 2022-04-19 MED ORDER — METRONIDAZOLE 0.75 % VA GEL: 1.0000 | Freq: Every day | VAGINAL | 1 refills | Status: DC

## 2022-04-19 NOTE — Telephone Encounter (Signed)
TC to patient to discuss results, Rx 

## 2022-04-19 NOTE — Addendum Note (Signed)
Addended by: Gerrit Heck L on: 04/19/2022 08:35 AM   Modules accepted: Orders

## 2022-04-19 NOTE — Telephone Encounter (Signed)
-----   Message from Gerrit Heck, [place] sent at 04/19/2022  8:35 AM EST ----- Please call patient and inform of results.  Tx for Metrogel sent to pharmacy on file.  Thanks

## 2022-05-31 ENCOUNTER — Emergency Department (HOSPITAL_COMMUNITY): Payer: Medicaid Other

## 2022-05-31 ENCOUNTER — Other Ambulatory Visit: Payer: Self-pay

## 2022-05-31 ENCOUNTER — Emergency Department (HOSPITAL_COMMUNITY)
Admission: EM | Admit: 2022-05-31 | Discharge: 2022-06-01 | Disposition: A | Discharge status: Home / Self Care | Payer: Medicaid Other | Attending: Emergency Medicine | Admitting: Emergency Medicine

## 2022-05-31 DIAGNOSIS — J111 Influenza due to unidentified influenza virus with other respiratory manifestations: Secondary | ICD-10-CM

## 2022-05-31 DIAGNOSIS — J101 Influenza due to other identified influenza virus with other respiratory manifestations: Secondary | ICD-10-CM | POA: Insufficient documentation

## 2022-05-31 DIAGNOSIS — Z20822 Contact with and (suspected) exposure to covid-19: Secondary | ICD-10-CM | POA: Insufficient documentation

## 2022-05-31 DIAGNOSIS — R7309 Other abnormal glucose: Secondary | ICD-10-CM | POA: Insufficient documentation

## 2022-05-31 LAB — RESP PANEL BY RT-PCR (RSV, FLU A&B, COVID)  RVPGX2
Influenza A by PCR: NEGATIVE
Influenza B by PCR: POSITIVE — AB
Resp Syncytial Virus by PCR: NEGATIVE
SARS Coronavirus 2 by RT PCR: NEGATIVE

## 2022-05-31 LAB — CBC WITH DIFFERENTIAL/PLATELET
Abs Immature Granulocytes: 0.02 10*3/uL (ref 0.00–0.07)
Basophils Absolute: 0 10*3/uL (ref 0.0–0.1)
Basophils Relative: 0 %
Eosinophils Absolute: 0 10*3/uL (ref 0.0–0.5)
Eosinophils Relative: 0 %
HCT: 44.3 % (ref 36.0–46.0)
Hemoglobin: 14.8 g/dL (ref 12.0–15.0)
Immature Granulocytes: 0 %
Lymphocytes Relative: 15 %
Lymphs Abs: 1.4 10*3/uL (ref 0.7–4.0)
MCH: 29.2 pg (ref 26.0–34.0)
MCHC: 33.4 g/dL (ref 30.0–36.0)
MCV: 87.4 fL (ref 80.0–100.0)
Monocytes Absolute: 0.8 10*3/uL (ref 0.1–1.0)
Monocytes Relative: 8 %
Neutro Abs: 7.1 10*3/uL (ref 1.7–7.7)
Neutrophils Relative %: 77 %
Platelets: 289 10*3/uL (ref 150–400)
RBC: 5.07 MIL/uL (ref 3.87–5.11)
RDW: 12.9 % (ref 11.5–15.5)
WBC: 9.4 10*3/uL (ref 4.0–10.5)
nRBC: 0 % (ref 0.0–0.2)

## 2022-05-31 LAB — COMPREHENSIVE METABOLIC PANEL
ALT: 21 U/L (ref 0–44)
AST: 27 U/L (ref 15–41)
Albumin: 3.7 g/dL (ref 3.5–5.0)
Alkaline Phosphatase: 53 U/L (ref 38–126)
Anion gap: 10 (ref 5–15)
BUN: 11 mg/dL (ref 6–20)
CO2: 24 mmol/L (ref 22–32)
Calcium: 9.1 mg/dL (ref 8.9–10.3)
Chloride: 104 mmol/L (ref 98–111)
Creatinine, Ser: 0.89 mg/dL (ref 0.44–1.00)
GFR, Estimated: >60 mL/min (ref >60)
Glucose, Bld: 92 mg/dL (ref 70–99)
Potassium: 3.9 mmol/L (ref 3.5–5.1)
Sodium: 138 mmol/L (ref 135–145)
Total Bilirubin: 0.3 mg/dL (ref 0.3–1.2)
Total Protein: 7 g/dL (ref 6.5–8.1)

## 2022-05-31 LAB — I-STAT BETA HCG BLOOD, ED (MC, WL, AP ONLY): I-stat hCG, quantitative: <5 m[IU]/mL (ref <5)

## 2022-05-31 LAB — CBG MONITORING, ED: Glucose-Capillary: 125 mg/dL — ABNORMAL HIGH (ref 70–99)

## 2022-05-31 LAB — TROPONIN I (HIGH SENSITIVITY): Troponin I (High Sensitivity): 3 ng/L (ref <18)

## 2022-05-31 LAB — D-DIMER, QUANTITATIVE: D-Dimer, Quant: 0.31 ug/mL-FEU (ref 0.00–0.50)

## 2022-05-31 MED ORDER — ALBUTEROL SULFATE HFA 108 (90 BASE) MCG/ACT IN AERS
2.0000 | INHALATION_SPRAY | Freq: Once | RESPIRATORY_TRACT | Status: AC
  Administered 2022-05-31: 2 via RESPIRATORY_TRACT
  Filled 2022-05-31: qty 6.7

## 2022-05-31 NOTE — ED Triage Notes (Signed)
SOB, productive cough and fevers x 4 days. Denies vomiting. Took 400mg  ibuprofen approx 2 hours ago. Throat pain, and mirgraines.

## 2022-05-31 NOTE — ED Provider Triage Note (Addendum)
Emergency Medicine Provider Triage Evaluation Note  Kathy Walker , a 27 y.o. female  was evaluated in triage.  Pt complains of productive cough for the past 4 days.  Her husband recently diagnosed with the flu.  She reports that she feels short of breath.  She had some ibuprofen earlier today..  Review of Systems  Positive:  Negative:   Physical Exam  BP (!) 146/104 (BP Location: Right Arm)   Pulse (!) 134   Temp 98.9 F (37.2 C) (Oral)   Resp (!) 29   Ht 5\' 4"  (1.626 m)   Wt 91.2 kg   SpO2 98%   BMI 34.50 kg/m  Gen:   Awake,  Resp:  Lungs are clear.  Tachypnea is distractible.  Not regular respirations.  She is able to speak in full sentences and then will change her respiratory rate.  MSK:   Moves extremities without difficulty  Other:  Tachypnea is distractible.  Is not regular.  Lungs are clear yet  Medical Decision Making  Medically screening exam initiated at 9:06 PM.  Appropriate orders placed.  Kathy Walker was informed that the remainder of the evaluation will be completed by another provider, this initial triage assessment does not replace that evaluation, and the importance of remaining in the ED until their evaluation is complete.  Labs ordered.   9:41 PM No longer tachypenic after inhaler. She reports she feels much better. Ambulatory with no acute distress.    Sherrell Puller, PA-C 05/31/22 2108    Sherrell Puller, PA-C 05/31/22 2142

## 2022-06-01 LAB — TROPONIN I (HIGH SENSITIVITY): Troponin I (High Sensitivity): <2 ng/L (ref <18)

## 2022-06-01 MED ORDER — SODIUM CHLORIDE 0.9 % IV BOLUS
1000.0000 mL | Freq: Once | INTRAVENOUS | Status: AC
  Administered 2022-06-01: 1000 mL via INTRAVENOUS

## 2022-06-01 MED ORDER — LORAZEPAM 1 MG PO TABS
1.0000 mg | ORAL_TABLET | Freq: Once | ORAL | Status: AC
  Administered 2022-06-01: 1 mg via ORAL
  Filled 2022-06-01: qty 1

## 2022-06-01 NOTE — ED Notes (Signed)
Pt provided with apple juice

## 2022-06-01 NOTE — Discharge Instructions (Signed)
Likely a viral infection, recommend over-the-counter pain medications like ibuprofen Tylenol for fever and pain control, nasal decongestions like Flonase and Zyrtec, Mucinex for cough.  If not eating recommend supplementing with Gatorade to help with electrolyte supplementation.  Follow-up PCP for further evaluation.  Come back to the emergency department if you develop chest pain, shortness of breath, severe abdominal pain, uncontrolled nausea, vomiting, diarrhea.  

## 2022-06-01 NOTE — ED Provider Notes (Signed)
[facility] EMERGENCY DEPARTMENT Provider Note   CSN: [id number] Arrival date & time: 05/31/22  2054     History  Chief Complaint  Patient presents with   Shortness of Breath    Kathy Walker is a 27 y.o. female.  HPI   Medical history including anxiety, presents with complaints of URI-like symptoms.  Start about 4 days ago, endorses cough congestion fevers and chills as well as general body aches.  She states that her husband was recently sick and she got what ever he had.  She thinks that she has the flu.  She did not get her flu vaccine this year.  She denies not has a history of asthma or COPD she does not smoke, she states she felt slightly wheezy upon getting here.  She denies any stomach pains nausea vomiting diarrhea she is all tolerating p.o.  She has no other complaints.    Home Medications Prior to Admission medications   Medication Sig Start Date End Date Taking? Authorizing Provider  diphenhydramine-acetaminophen (TYLENOL PM) 25-500 MG TABS tablet Take 1 tablet by mouth at bedtime as needed.   Yes [provider]  ibuprofen (ADVIL) 200 MG tablet Take 200 mg by mouth every 6 (six) hours as needed for moderate pain.   Yes [provider]  norethindrone-ethinyl estradiol-iron (LARIN FE 1.5/30) 1.5-30 MG-MCG tablet Take 1 tablet by mouth daily. 04/17/22  Yes Griffin Basil, MD  metroNIDAZOLE (FLAGYL) 500 MG tablet Take 1 tablet (500 mg total) by mouth 2 (two) times daily. Patient not taking: Reported on 05/31/2022 04/19/22   Shelly Bombard, MD      Allergies    Marcelline Mates and Raspberry    Review of Systems   Review of Systems  Constitutional:  Positive for chills and fever.  Respiratory:  Positive for cough and wheezing. Negative for shortness of breath.   Cardiovascular:  Negative for chest pain.  Gastrointestinal:  Negative for abdominal pain.  Musculoskeletal:  Positive for myalgias.  Neurological:  Negative for headaches.     Physical Exam Updated Vital Signs BP (!) 138/96 (BP Location: Right Arm)   Pulse (!) 122   Temp 98.7 F (37.1 C) (Oral)   Resp 20   Ht 5\' 4"  (1.626 m)   Wt 91.2 kg   SpO2 100%   BMI 34.50 kg/m  Physical Exam Vitals and nursing note reviewed.  Constitutional:      General: She is not in acute distress.    Appearance: She is not ill-appearing.  HENT:     Head: Normocephalic and atraumatic.     Nose: No congestion.     Mouth/Throat:     Mouth: Mucous membranes are moist.     Pharynx: Oropharynx is clear. No oropharyngeal exudate.  Eyes:     Conjunctiva/sclera: Conjunctivae normal.  Cardiovascular:     Rate and Rhythm: Regular rhythm. Tachycardia present.     Pulses: Normal pulses.     Heart sounds: No murmur heard.    No friction rub. No gallop.  Pulmonary:     Effort: No respiratory distress.     Breath sounds: No wheezing, rhonchi or rales.     Comments: No evidence of respiratory distress, slightly tachypneic but speaking full sentences, lung sounds are clear bilaterally. Abdominal:     Palpations: Abdomen is soft.     Tenderness: There is no abdominal tenderness. There is no right CVA tenderness or left CVA tenderness.  Musculoskeletal:     Right  lower leg: No edema.     Left lower leg: No edema.     Comments: No calf tenderness no unilateral leg swelling  Skin:    General: Skin is warm and dry.  Neurological:     Mental Status: She is alert.  Psychiatric:        Mood and Affect: Mood normal.     ED Results / Procedures / Treatments   Labs (all labs ordered are listed, but only abnormal results are displayed) Labs Reviewed  RESP PANEL BY RT-PCR (RSV, FLU A&B, COVID)  RVPGX2 - Abnormal; Notable for the following components:      Result Value   Influenza B by PCR POSITIVE (*)    All other components within normal limits  CBG MONITORING, ED - Abnormal; Notable for the following components:   Glucose-Capillary 125 (*)    All other components within  normal limits  CBC WITH DIFFERENTIAL/PLATELET  COMPREHENSIVE METABOLIC PANEL  D-DIMER, QUANTITATIVE  I-STAT BETA HCG BLOOD, ED ([facility], [facility], [facility] ONLY)  TROPONIN I (HIGH SENSITIVITY)  TROPONIN I (HIGH SENSITIVITY)    EKG None  Radiology DG Chest 2 View  Result Date: 05/31/2022 CLINICAL DATA:  Shortness of breath and cough. EXAM: CHEST - 2 VIEW COMPARISON:  09/02/2020. FINDINGS: The heart size and mediastinal contours are within normal limits. Both lungs are clear. No acute osseous abnormality. IMPRESSION: No active cardiopulmonary disease. Electronically Signed   By: Thornell Sartorius M.D.   On: 05/31/2022 21:19    Procedures Procedures    Medications Ordered in ED Medications  albuterol (VENTOLIN HFA) 108 (90 Base) MCG/ACT inhaler 2 puff (2 puffs Inhalation Given 05/31/22 2112)  sodium chloride 0.9 % bolus 1,000 mL (0 mLs Intravenous Stopped 06/01/22 0326)  sodium chloride 0.9 % bolus 1,000 mL (0 mLs Intravenous Stopped 06/01/22 0515)  LORazepam (ATIVAN) tablet 1 mg (1 mg Oral Given 06/01/22 0526)    ED Course/ Medical Decision Making/ A&P                             Medical Decision Making Risk Prescription drug management.   This patient presents to the ED for concern of URI-like symptoms, this involves an extensive number of treatment options, and is a complaint that carries with it a high risk of complications and morbidity.  The differential diagnosis includes PE, ACS, pneumonia    Additional history obtained:  Additional history obtained from N/A External records from outside source obtained and reviewed including recent ER notes   Co morbidities that complicate the patient evaluation  Anxiety  Social Determinants of Health:  No PCP    Lab Tests:  I Ordered, and personally interpreted labs.  The pertinent results include: CBC is unremarkable, CMP is unremarkable, negative hCG, negative D-dimer, negative delta troponins respiratory panel positive for  influenza   Imaging Studies ordered:  I ordered imaging studies including chest x-ray I independently visualized and interpreted imaging which showed negative acute findings I agree with the radiologist interpretation   Cardiac Monitoring:  The patient was maintained on a cardiac monitor.  I personally viewed and interpreted the cardiac monitored which showed an underlying rhythm of: EKG without signs of ischemia   Medicines ordered and prescription drug management:  I ordered medication including fluids I have reviewed the patients home medicines and have made adjustments as needed  Critical Interventions:  N/a   Reevaluation:  Patient was seen up in triage, lab work imaging  were obtained, significant findings including positive respiratory panel for influenza, she was given a albuterol inhaler up in triage, she was notably tachycardic, when I questioned her about how meantime she is taking albuterol inhaler she says approximately 6-7 times, I suspect this is likely because of her significantly elevated heart rate.  Will provide with fluids and reassess.  Patient was reassessed after first liter of fluids, her heart rate still remains tachycardic, her respiratory rate has improved which was 20 on my evaluation.  Patient states that she is little anxious since she took too much albuterol she concerned that she might of her heart.  I explained to her that is not the case.  Will provide her with additional fluids and reassess  Patient was reassessed after second liter of fluids still remains tachycardic, she states she still feeling anxious, again respiratory rate was 22 on my assessment, no evidence of respiratory distress, will provide her with some Ativan and reassess.  Reassessed the patient, heart rate has slightly improved she is now in the low 120s, respirations have also improved, lung sounds are clear bilaterally, she does not endorse any chest pain or shortness of breath, she  is agreement discharge at this time.      Consultations Obtained:  N/A    Test Considered:  CTA of chest-this will be deferred as my suspicion for PE is very low at this time, she had negative D-dimer, she has negative delta troponins, she is notably tachycardic and has some slightly elevated respiratory rates but this is not can be explained by influenza as well as taking multiple puffs of albuterol.    Rule out I have low suspicion for ACS as history is atypical, patient has no cardiac history, EKG was sinus rhythm without signs of ischemia, patient had negative delta troponin.  Low suspicion for AAA or aortic dissection as history is atypical, patient has low risk factors.  Low suspicion for systemic infection as patient is nontoxic-appearing, vital signs reassuring, no obvious source infection noted on exam.     Dispostion and problem list  After consideration of the diagnostic results and the patients response to treatment, I feel that the patent would benefit from discharge.  Influenza-patient symptoms started about 4 days ago she is outside the treatment window, she has no comorbidities increasing her risk of adverse outcome.  Will recommend symptom management.  She was notably tachycardic here but again I believe this is multifactorial anxiety as well as multiple pulse of albuterol.  Will not provide additional albuterol at this time  Strict return precautions.            Final Clinical Impression(s) / ED Diagnoses Final diagnoses:  Influenza    Rx / DC Orders ED Discharge Orders     None         Marcello Fennel, PA-C 06/01/22 0701    Maudie Flakes, MD 06/02/22 [phone]

## 2022-08-03 ENCOUNTER — Emergency Department (HOSPITAL_COMMUNITY): Payer: Self-pay

## 2022-08-03 ENCOUNTER — Encounter (HOSPITAL_COMMUNITY): Payer: Self-pay

## 2022-08-03 ENCOUNTER — Emergency Department (HOSPITAL_COMMUNITY)
Admission: EM | Admit: 2022-08-03 | Discharge: 2022-08-04 | Disposition: A | Discharge status: Home / Self Care | Payer: Self-pay | Attending: Emergency Medicine | Admitting: Emergency Medicine

## 2022-08-03 DIAGNOSIS — M542 Cervicalgia: Secondary | ICD-10-CM | POA: Insufficient documentation

## 2022-08-03 LAB — I-STAT CHEM 8, ED
BUN: 14 mg/dL (ref 6–20)
Calcium, Ion: 1.21 mmol/L (ref 1.15–1.40)
Chloride: 104 mmol/L (ref 98–111)
Creatinine, Ser: 0.5 mg/dL (ref 0.44–1.00)
Glucose, Bld: 106 mg/dL — ABNORMAL HIGH (ref 70–99)
HCT: 39 % (ref 36.0–46.0)
Hemoglobin: 13.3 g/dL (ref 12.0–15.0)
Potassium: 3.6 mmol/L (ref 3.5–5.1)
Sodium: 141 mmol/L (ref 135–145)
TCO2: 26 mmol/L (ref 22–32)

## 2022-08-03 LAB — CBC WITH DIFFERENTIAL/PLATELET
Abs Immature Granulocytes: 0.01 10*3/uL (ref 0.00–0.07)
Basophils Absolute: 0 10*3/uL (ref 0.0–0.1)
Basophils Relative: 1 %
Eosinophils Absolute: 0.1 10*3/uL (ref 0.0–0.5)
Eosinophils Relative: 2 %
HCT: 39.4 % (ref 36.0–46.0)
Hemoglobin: 13.2 g/dL (ref 12.0–15.0)
Immature Granulocytes: 0 %
Lymphocytes Relative: 46 %
Lymphs Abs: 2.8 10*3/uL (ref 0.7–4.0)
MCH: 28.8 pg (ref 26.0–34.0)
MCHC: 33.5 g/dL (ref 30.0–36.0)
MCV: 85.8 fL (ref 80.0–100.0)
Monocytes Absolute: 0.7 10*3/uL (ref 0.1–1.0)
Monocytes Relative: 11 %
Neutro Abs: 2.4 10*3/uL (ref 1.7–7.7)
Neutrophils Relative %: 40 %
Platelets: 290 10*3/uL (ref 150–400)
RBC: 4.59 MIL/uL (ref 3.87–5.11)
RDW: 13.1 % (ref 11.5–15.5)
WBC: 6 10*3/uL (ref 4.0–10.5)
nRBC: 0 % (ref 0.0–0.2)

## 2022-08-03 LAB — I-STAT BETA HCG BLOOD, ED (MC, WL, AP ONLY): I-stat hCG, quantitative: <5 m[IU]/mL (ref <5)

## 2022-08-03 MED ORDER — LIDOCAINE 5 % EX PTCH
1.0000 | MEDICATED_PATCH | CUTANEOUS | Status: DC
  Administered 2022-08-03: 1 via TRANSDERMAL
  Filled 2022-08-03: qty 1

## 2022-08-03 MED ORDER — IOHEXOL 350 MG/ML SOLN
75.0000 mL | Freq: Once | INTRAVENOUS | Status: AC | PRN
  Administered 2022-08-03: 75 mL via INTRAVENOUS

## 2022-08-03 MED ORDER — METHOCARBAMOL 500 MG PO TABS
500.0000 mg | ORAL_TABLET | Freq: Once | ORAL | Status: AC
  Administered 2022-08-03: 500 mg via ORAL
  Filled 2022-08-03: qty 1

## 2022-08-03 NOTE — ED Provider Notes (Signed)
[facility] Provider Note   CSN: [id number] Arrival date & time: 08/03/22  1830     History  Chief Complaint  Patient presents with   Neck Pain    Kathy Walker is a 27 y.o. female presenting today with neck pain.  She reports 3 days ago she was pulling weeds in the yard and came up on a root.  She reports pulling very hard back-and-forth and the morning after feeling some dull aching pain to the left side of her neck.  Today she started to have neck pain and bilateral neck stiffness that radiates from her bilateral neck up into her occiput.  Says that she also is having white spots in her vision.  No fevers or chills.   Neck Pain      Home Medications Prior to Admission medications   Medication Sig Start Date End Date Taking? Authorizing Provider  diphenhydramine-acetaminophen (TYLENOL PM) 25-500 MG TABS tablet Take 1 tablet by mouth at bedtime as needed.    [provider]  ibuprofen (ADVIL) 200 MG tablet Take 200 mg by mouth every 6 (six) hours as needed for moderate pain.    [provider]  metroNIDAZOLE (FLAGYL) 500 MG tablet Take 1 tablet (500 mg total) by mouth 2 (two) times daily. Patient not taking: Reported on 05/31/2022 04/19/22   Shelly Bombard, MD  norethindrone-ethinyl estradiol-iron Ronnette Juniper FE 1.5/30) 1.5-30 MG-MCG tablet Take 1 tablet by mouth daily. 04/17/22   Griffin Basil, MD      Allergies    Marcelline Mates and Raspberry    Review of Systems   Review of Systems  Musculoskeletal:  Positive for neck pain.    Physical Exam Updated Vital Signs BP (!) 144/95   Pulse 83   Temp 97.8 F (36.6 C)   Resp 16   SpO2 97%  Physical Exam Vitals and nursing note reviewed.  Constitutional:      Appearance: Normal appearance.  HENT:     Head: Normocephalic and atraumatic.  Eyes:     General: No scleral icterus.    Conjunctiva/sclera: Conjunctivae normal.     Comments: Strabismus, exotropia of left  eye.  Unsure baseline  Neck:     Comments: Patient with limited lateral rotation and flexion of the C-spine.  Still able to flex but some difficulty with extending the C-spine. Pulmonary:     Effort: Pulmonary effort is normal. No respiratory distress.  Skin:    Findings: No rash.  Neurological:     Mental Status: She is alert.     Comments: Cranial nerves II through XII grossly intact.  PERRLA bilaterally.  EOMs intact and no dysmetria on finger-nose testing.  No facial droop or aphasia.  Psychiatric:        Mood and Affect: Mood normal.     ED Results / Procedures / Treatments   Labs (all labs ordered are listed, but only abnormal results are displayed) Labs Reviewed  I-STAT CHEM 8, ED - Abnormal; Notable for the following components:      Result Value   Glucose, Bld 106 (*)    All other components within normal limits  CBC WITH DIFFERENTIAL/PLATELET  I-STAT BETA HCG BLOOD, ED ([facility], [facility], [facility] ONLY)    EKG None  Radiology No results found.  Procedures Procedures   Medications Ordered in ED Medications  lidocaine (LIDODERM) 5 % 1 patch (1 patch Transdermal Patch Applied 08/03/22 1955)  methocarbamol (ROBAXIN) tablet 500 mg (500  mg Oral Given 08/03/22 1955)    ED Course/ Medical Decision Making/ A&P Clinical Course as of 08/03/22 2120  Fri Aug 03, 2022  2117 Sleeping comfortably with head rotated to the right [MR]    Clinical Course User Index [MR] Arnetta Odeh, Cecilio Asper, PA-C                             Medical Decision Making Amount and/or Complexity of Data Reviewed Labs: ordered. Radiology: ordered.  Risk Prescription drug management.   27 year old female presenting today with neck pain and stiffness.  Differential includes but is not limited to torticollis, muscle strain, spinal stenosis, meningitis, vertebral artery dissection   this is not an exhaustive differential.    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   Physical Exam: Pertinent physical exam findings include Normal strength in bilateral upper extremities.  No dysmetria but sluggish.  Normal neuroexam in its entirety With some exotropia/strabismus. Could be baseline Difficulty laterally rotating and flexing the C-spine  Lab Tests: I ordered, and personally interpreted labs.  The pertinent results include: unremarkable   Imaging Studies: I ordered and independently visualized and interpreted CT angiogram    Medications: I ordered medication including robaxin and lidoderm.     MDM/Disposition: This is a female presenting today with neck pain.  She reports that all of this started after she was pulling weeds around 3 days ago.  Due to her neck pain, stiffness and visual disturbances in the setting of an injury 3 days ago, I had some concern for a vertebral artery dissection.  Angiogram was ordered and is pending at this time.  Patient signed out to [facility].  Please see their note for results of CT scan and ultimate disposition.  I suspect that patient will need neurologic consult with any abnormalities, otherwise can be treated as though her pain is musculoskeletal.    Final Clinical Impression(s) / ED Diagnoses Final diagnoses:  None    Rx / DC Orders ED Discharge Orders     None       I discussed this case with my attending physician Dr. Billy Fischer who cosigned this note including patient's presenting symptoms, physical exam, and planned diagnostics and interventions. Attending physician stated agreement with plan or made changes to plan which were implemented.      Darliss Ridgel 08/03/22 2205    Gareth Morgan, MD 08/04/22 1254

## 2022-08-03 NOTE — ED Notes (Signed)
Pt at CT. States her pain is much better and she was able to sleep. Really enjoys the lidocaine patch.

## 2022-08-03 NOTE — ED Triage Notes (Addendum)
Pt states 2-3 days ago cleaning house, pulling out weeds; started feeling aching pain in neck after, took tylenol; next day experiencing neck stiffness; endorsing pinching, warm sensation from back of head, down neck and between shoulder blades; associated intermittent migraines, blurry vision, nausea; no blurry vision or HA now; pt alert, oriented, ambulatory, NAD in triage

## 2022-08-04 MED ORDER — METHOCARBAMOL 500 MG PO TABS: 500.0000 mg | ORAL_TABLET | Freq: Two times a day (BID) | ORAL | 0 refills | Status: DC

## 2022-08-04 NOTE — Discharge Instructions (Signed)
Please return to the ED with any new or worsening signs or symptoms Please follow-up with your PCP for reevaluation You may continue taking ibuprofen or Tylenol for muscle aches and pains.  You may also take muscle relaxer twice a day.  Please do not drive or operate heavy machinery under the influence of muscle relaxers.  I have sent muscle relaxer to the pharmacy requested.

## 2022-08-04 NOTE — ED Provider Notes (Signed)
  Physical Exam  BP 118/83   Pulse 73   Temp 98 F (36.7 C) (Oral)   Resp 16   SpO2 98%   Physical Exam Vitals and nursing note reviewed.  Constitutional:      General: She is not in acute distress.    Appearance: She is well-developed.  HENT:     Head: Normocephalic and atraumatic.  Eyes:     Conjunctiva/sclera: Conjunctivae normal.  Cardiovascular:     Rate and Rhythm: Normal rate and regular rhythm.     Heart sounds: No murmur heard. Pulmonary:     Effort: Pulmonary effort is normal. No respiratory distress.     Breath sounds: Normal breath sounds.  Abdominal:     Palpations: Abdomen is soft.     Tenderness: There is no abdominal tenderness.  Musculoskeletal:        General: No swelling.     Cervical back: Neck supple. Tenderness present.  Skin:    General: Skin is warm and dry.     Capillary Refill: Capillary refill takes less than 2 seconds.  Neurological:     Mental Status: She is alert.  Psychiatric:        Mood and Affect: Mood normal.     Procedures  Procedures  ED Course / MDM   Clinical Course as of 08/04/22 0025  Fri Aug 03, 2022  2117 Sleeping comfortably with head rotated to the right [MR]    Clinical Course User Index [MR] [place], Cecilio Asper, PA-C   Medical Decision Making Amount and/or Complexity of Data Reviewed Labs: ordered. Radiology: ordered.  Risk Prescription drug management.   Patient signed out to me at shift change pending CT angiogram of neck.  Please see previous provider note for further details.   In short this is a 27 year old female who presents for neck pain.  The patient was signed out to me pending CT angiogram of neck.  CT angiogram does not show any evidence of large vessel occlusion or stenosis.  On reassessment patient reports that her neck pain is decreased at this time.  Patient stable for discharge.  The patient most likely suffering from musculoskeletal neck pain.  Patient will be sent home with muscle  relaxers and advised to continue taking ibuprofen or Tylenol for pain.  Patient advised to return to the ED with any new or worsening signs or symptoms.  Patient advised to follow-up with PCP for reevaluation.  Patient voiced understanding.  Patient discharged in stable condition.      Azucena Cecil, PA-C 08/04/22 Clarnce Flock, MD 08/04/22 [phone]

## 2023-04-09 ENCOUNTER — Ambulatory Visit: Payer: Medicaid Other | Admitting: Advanced Practice Midwife

## 2023-04-09 ENCOUNTER — Other Ambulatory Visit (HOSPITAL_COMMUNITY)
Admission: RE | Admit: 2023-04-09 | Discharge: 2023-04-09 | Disposition: A | Discharge status: Home / Self Care | Payer: Medicaid Other | Source: Ambulatory Visit | Attending: Advanced Practice Midwife | Admitting: Advanced Practice Midwife

## 2023-04-09 VITALS — BP 132/86 | HR 82 | Ht 64.0 in | Wt 191.2 lb

## 2023-04-09 DIAGNOSIS — Z113 Encounter for screening for infections with a predominantly sexual mode of transmission: Secondary | ICD-10-CM

## 2023-04-09 DIAGNOSIS — Z01419 Encounter for gynecological examination (general) (routine) without abnormal findings: Secondary | ICD-10-CM | POA: Diagnosis present

## 2023-04-09 DIAGNOSIS — R0789 Other chest pain: Secondary | ICD-10-CM

## 2023-04-09 DIAGNOSIS — Z3041 Encounter for surveillance of contraceptive pills: Secondary | ICD-10-CM

## 2023-04-09 DIAGNOSIS — Z3009 Encounter for other general counseling and advice on contraception: Secondary | ICD-10-CM

## 2023-04-09 MED ORDER — OMEPRAZOLE MAGNESIUM 20 MG PO TBEC: 20.0000 mg | DELAYED_RELEASE_TABLET | Freq: Every day | ORAL | 0 refills | Status: AC

## 2023-04-09 MED ORDER — NORETHIN ACE-ETH ESTRAD-FE 1.5-30 MG-MCG PO TABS: 1.0000 | ORAL_TABLET | Freq: Every day | ORAL | 11 refills | Status: DC

## 2023-04-09 MED ORDER — CYCLOBENZAPRINE HCL 10 MG PO TABS: ORAL_TABLET | ORAL | 0 refills | Status: AC

## 2023-04-09 NOTE — Progress Notes (Signed)
Subjective:     Kathy Walker is a 27 y.o. female here at [facility] for a routine exam.  Current complaints: weight loss with recent diet changes, onset of pain in left upper chest ~ 1 week ago, improved with pressure/tapping on chest.  Associated with some difficulty taking a deep breath. Hx childhood asthma but none recently.  Pt reports anxiety since having children.   Personal and family health history reviewed: yes.  Do you have a primary care provider? yes Do you feel safe at home? yes  Flowsheet Row Office Visit from 04/09/2023 in [facility] at [facility] Total Score 2       Health Maintenance Due  Topic Date Due   HPV VACCINES (1 - 3-dose series) Never done   INFLUENZA VACCINE  12/13/2022   COVID-19 Vaccine (1 - 2023-24 season) Never done     Risk factors for chronic health problems: Smoking: Alchohol/how much: Pt BMI: Body mass index is 32.82 kg/m.   Gynecologic History Patient's last menstrual period was 03/08/2023. Contraception: OCP (estrogen/progesterone) Last Pap: 03/13/21. Results were: normal Last mammogram: n/a.   Obstetric History OB History  Gravida Para Term Preterm AB Living  2 2 2  0 0 2  SAB IAB Ectopic Multiple Live Births  0 0 0 0 2    # Outcome Date GA Lbr Len/2nd Weight Sex Type Anes PTL Lv  2 Term 05/23/16 [redacted]w[redacted]d 18:55 / 00:02 7 lb 12 oz (3.515 kg) F Vag-Spont None  LIV  1 Term 02/09/15 [redacted]w[redacted]d  8 lb 2 oz (3.685 kg) F Vag-Spont  N LIV     The following portions of the patient's history were reviewed and updated as appropriate: allergies, current medications, past family history, past medical history, past social history, past surgical history, and problem list.  Review of Systems Pertinent items noted in HPI and remainder of comprehensive ROS otherwise negative.    Objective:  BP 132/86   Pulse 82   Ht 5\' 4"  (1.626 m)   Wt 191 lb 3.2 oz (86.7 kg)   LMP 03/08/2023   BMI 32.82 kg/m   VS reviewed,  nursing note reviewed,  Constitutional: well developed, well nourished, no distress HEENT: normocephalic, thyroid without enlargement or mass HEART: RRR, no murmurs rubs/gallops RESP: clear and equal to auscultation bilaterally in all lobes  Breast Exam:exam performed: right breast normal without mass, skin or nipple changes or axillary nodes, left breast normal without mass, skin or nipple changes or axillary nodes Abdomen: soft Neuro: alert and oriented x 3 Skin: warm, dry Psych: affect normal Pelvic exam:Performed: Cervix pink, visually closed, without lesion, scant white creamy discharge, vaginal walls and external genitalia normal Bimanual exam: Cervix 0/long/high, firm, anterior, neg CMT, uterus nontender, nonenlarged, adnexa without tenderness, enlargement, or mass        Assessment/Plan:   1. Encounter for annual routine gynecological examination - Cytology - PAP( [facility])  2. Routine screening for STI (sexually transmitted infection)  - Cervicovaginal ancillary only( [place]) - HIV antibody (with reflex) - [facility] - Hepatitis B Surface AntiGEN - Hepatitis C Antibody  3. Left-sided chest wall pain --Pain is reproducible, improves with pressure against chest wall.   --Esophageal spasms from GERD vs MSK pain --If no improvement, consider cardiology referral but pt to try PPI 2 weeks, if not improved, try Flexeril PRN.    - omeprazole (PRILOSEC OTC) 20 MG tablet; Take 1 tablet (20 mg total) by mouth daily.  Dispense: 30 tablet; Refill: 0 - cyclobenzaprine (FLEXERIL) 10 MG tablet; Take every 12 hours as needed.  May cause drowsiness.  Use AFTER 1-2 week course of Prilosec (omeprazole) is completed.  Dispense: 15 tablet; Refill: 0    4. Encounter for counseling regarding contraception --Discussed pt contraceptive plans and reviewed contraceptive methods based on pt preferences and effectiveness.  Pt prefers  to continue OCPs at this time, consider IUD in the future.    --BP wnl today, no HTN diagnosis --Rx for OCP renewed  Return in about 3 months (around 07/10/2023) for Gyn follow up for contraception with me.   Sharen Counter, CNM 1:13 PM

## 2023-04-09 NOTE — Progress Notes (Signed)
Pt presents for AEX. Last PAP 03-13-21 ASC-US. Pt c/o bleeding after anal intercourse. Requesting STD testing. Needs BC refill  Pt BP is elevated today. Pt c/o chest tightness at times. Pt also c/o anxiety.

## 2023-04-10 LAB — CERVICOVAGINAL ANCILLARY ONLY
Chlamydia: NEGATIVE
Comment: NEGATIVE
Comment: NEGATIVE
Comment: NORMAL
Neisseria Gonorrhea: NEGATIVE
Trichomonas: NEGATIVE

## 2023-04-10 LAB — HEPATITIS C ANTIBODY: Hep C Virus Ab: NONREACTIVE

## 2023-04-10 LAB — HIV ANTIBODY (ROUTINE TESTING W REFLEX): HIV Screen 4th Generation wRfx: NONREACTIVE

## 2023-04-10 LAB — HEPATITIS B SURFACE ANTIGEN: Hepatitis B Surface Ag: NEGATIVE

## 2023-04-10 LAB — RPR: RPR Ser Ql: NONREACTIVE

## 2023-04-12 ENCOUNTER — Encounter: Payer: Self-pay | Admitting: Advanced Practice Midwife

## 2023-04-12 LAB — CYTOLOGY - PAP: Diagnosis: NEGATIVE

## 2023-06-10 ENCOUNTER — Ambulatory Visit: Payer: Self-pay | Admitting: Obstetrics

## 2024-03-10 ENCOUNTER — Other Ambulatory Visit: Payer: Self-pay

## 2024-03-10 DIAGNOSIS — Z3041 Encounter for surveillance of contraceptive pills: Secondary | ICD-10-CM

## 2024-03-10 MED ORDER — NORETHIN ACE-ETH ESTRAD-FE 1.5-30 MG-MCG PO TABS: 1.0000 | ORAL_TABLET | Freq: Every day | ORAL | 1 refills | Status: AC

## 2024-06-16 ENCOUNTER — Encounter: Payer: Self-pay | Admitting: Obstetrics

## 2024-06-16 ENCOUNTER — Ambulatory Visit: Payer: Self-pay | Admitting: Obstetrics

## 2024-06-16 VITALS — BP 132/78 | HR 89 | Ht 64.0 in | Wt 208.3 lb

## 2024-06-16 DIAGNOSIS — N946 Dysmenorrhea, unspecified: Secondary | ICD-10-CM

## 2024-06-16 DIAGNOSIS — N898 Other specified noninflammatory disorders of vagina: Secondary | ICD-10-CM

## 2024-06-16 DIAGNOSIS — Z01419 Encounter for gynecological examination (general) (routine) without abnormal findings: Secondary | ICD-10-CM

## 2024-06-16 DIAGNOSIS — E669 Obesity, unspecified: Secondary | ICD-10-CM

## 2024-06-16 MED ORDER — IBUPROFEN 800 MG PO TABS: 800.0000 mg | ORAL_TABLET | Freq: Three times a day (TID) | ORAL | 5 refills | Status: AC | PRN

## 2024-06-17 LAB — SYPHILIS: RPR W/REFLEX TO RPR TITER AND TREPONEMAL ANTIBODIES, TRADITIONAL SCREENING AND DIAGNOSIS ALGORITHM: RPR Ser Ql: NONREACTIVE

## 2024-06-17 LAB — HEPATITIS C ANTIBODY: Hep C Virus Ab: NONREACTIVE

## 2024-06-17 LAB — HIV ANTIBODY (ROUTINE TESTING W REFLEX): HIV Screen 4th Generation wRfx: NONREACTIVE

## 2024-06-17 LAB — HEPATITIS B SURFACE ANTIGEN: Hepatitis B Surface Ag: NEGATIVE

## 2024-06-18 LAB — CERVICOVAGINAL ANCILLARY ONLY
Bacterial Vaginitis (gardnerella): NEGATIVE
Candida Glabrata: NEGATIVE
Candida Vaginitis: NEGATIVE
Chlamydia: NEGATIVE
Comment: NEGATIVE
Comment: NEGATIVE
Comment: NEGATIVE
Comment: NEGATIVE
Comment: NEGATIVE
Comment: NORMAL
Neisseria Gonorrhea: NEGATIVE
Trichomonas: NEGATIVE
